# Patient Record
Sex: Female | Born: 1970 | Hispanic: No | Smoking: Never smoker
Health system: Southern US, Community
[De-identification: ages and names within clinical notes are randomized; demographics above are authoritative.]

## PROBLEM LIST (undated history)

## (undated) DIAGNOSIS — I10 Essential (primary) hypertension: Secondary | ICD-10-CM

## (undated) HISTORY — PX: ABDOMINAL HYSTERECTOMY: SHX81

---

## 2006-03-19 ENCOUNTER — Other Ambulatory Visit: Admission: RE | Admit: 2006-03-19 | Discharge: 2006-03-19 | Payer: Self-pay | Admitting: Family Medicine

## 2007-03-25 ENCOUNTER — Other Ambulatory Visit: Admission: RE | Admit: 2007-03-25 | Discharge: 2007-03-25 | Payer: Self-pay | Admitting: Family Medicine

## 2010-05-25 ENCOUNTER — Other Ambulatory Visit: Admission: RE | Admit: 2010-05-25 | Discharge: 2010-05-25 | Payer: Self-pay | Admitting: Family Medicine

## 2011-08-19 DIAGNOSIS — E559 Vitamin D deficiency, unspecified: Secondary | ICD-10-CM | POA: Insufficient documentation

## 2011-08-19 DIAGNOSIS — L709 Acne, unspecified: Secondary | ICD-10-CM | POA: Insufficient documentation

## 2011-08-19 HISTORY — DX: Acne, unspecified: L70.9

## 2011-10-31 DIAGNOSIS — D259 Leiomyoma of uterus, unspecified: Secondary | ICD-10-CM | POA: Insufficient documentation

## 2013-06-25 ENCOUNTER — Other Ambulatory Visit: Payer: Self-pay

## 2013-06-25 DIAGNOSIS — Z1231 Encounter for screening mammogram for malignant neoplasm of breast: Secondary | ICD-10-CM

## 2013-07-26 ENCOUNTER — Ambulatory Visit
Admission: RE | Admit: 2013-07-26 | Discharge: 2013-07-26 | Disposition: A | Discharge status: Home / Self Care | Payer: 59 | Source: Ambulatory Visit

## 2013-07-26 DIAGNOSIS — Z1231 Encounter for screening mammogram for malignant neoplasm of breast: Secondary | ICD-10-CM

## 2015-07-12 DIAGNOSIS — D251 Intramural leiomyoma of uterus: Secondary | ICD-10-CM | POA: Insufficient documentation

## 2016-03-06 DIAGNOSIS — R7309 Other abnormal glucose: Secondary | ICD-10-CM | POA: Diagnosis not present

## 2016-03-06 DIAGNOSIS — I10 Essential (primary) hypertension: Secondary | ICD-10-CM | POA: Diagnosis not present

## 2016-06-20 DIAGNOSIS — Z6836 Body mass index (BMI) 36.0-36.9, adult: Secondary | ICD-10-CM | POA: Diagnosis not present

## 2016-06-20 DIAGNOSIS — I10 Essential (primary) hypertension: Secondary | ICD-10-CM | POA: Diagnosis not present

## 2016-06-20 DIAGNOSIS — Z90710 Acquired absence of both cervix and uterus: Secondary | ICD-10-CM | POA: Diagnosis not present

## 2016-06-20 DIAGNOSIS — Z01419 Encounter for gynecological examination (general) (routine) without abnormal findings: Secondary | ICD-10-CM | POA: Diagnosis not present

## 2016-11-15 DIAGNOSIS — R7303 Prediabetes: Secondary | ICD-10-CM | POA: Diagnosis not present

## 2016-11-15 DIAGNOSIS — R7309 Other abnormal glucose: Secondary | ICD-10-CM | POA: Diagnosis not present

## 2016-11-15 DIAGNOSIS — Z0000 Encounter for general adult medical examination without abnormal findings: Secondary | ICD-10-CM | POA: Diagnosis not present

## 2016-11-15 DIAGNOSIS — E669 Obesity, unspecified: Secondary | ICD-10-CM | POA: Diagnosis not present

## 2016-11-15 DIAGNOSIS — I10 Essential (primary) hypertension: Secondary | ICD-10-CM | POA: Diagnosis not present

## 2016-11-15 DIAGNOSIS — Z6837 Body mass index (BMI) 37.0-37.9, adult: Secondary | ICD-10-CM | POA: Diagnosis not present

## 2017-10-06 DIAGNOSIS — I10 Essential (primary) hypertension: Secondary | ICD-10-CM | POA: Diagnosis not present

## 2017-10-06 DIAGNOSIS — E559 Vitamin D deficiency, unspecified: Secondary | ICD-10-CM | POA: Diagnosis not present

## 2017-10-06 DIAGNOSIS — E669 Obesity, unspecified: Secondary | ICD-10-CM | POA: Diagnosis not present

## 2017-10-06 DIAGNOSIS — R7303 Prediabetes: Secondary | ICD-10-CM | POA: Diagnosis not present

## 2017-10-06 DIAGNOSIS — Z6834 Body mass index (BMI) 34.0-34.9, adult: Secondary | ICD-10-CM | POA: Diagnosis not present

## 2018-02-04 DIAGNOSIS — E669 Obesity, unspecified: Secondary | ICD-10-CM | POA: Diagnosis not present

## 2018-02-04 DIAGNOSIS — Z0000 Encounter for general adult medical examination without abnormal findings: Secondary | ICD-10-CM | POA: Diagnosis not present

## 2018-02-04 DIAGNOSIS — Z1231 Encounter for screening mammogram for malignant neoplasm of breast: Secondary | ICD-10-CM | POA: Diagnosis not present

## 2018-02-04 DIAGNOSIS — E559 Vitamin D deficiency, unspecified: Secondary | ICD-10-CM | POA: Diagnosis not present

## 2018-02-04 DIAGNOSIS — I10 Essential (primary) hypertension: Secondary | ICD-10-CM | POA: Diagnosis not present

## 2018-02-04 DIAGNOSIS — R7303 Prediabetes: Secondary | ICD-10-CM | POA: Diagnosis not present

## 2018-02-26 ENCOUNTER — Emergency Department (HOSPITAL_COMMUNITY)
Admission: EM | Admit: 2018-02-26 | Discharge: 2018-02-26 | Disposition: A | Discharge status: Home / Self Care | Payer: No Typology Code available for payment source | Attending: Emergency Medicine | Admitting: Emergency Medicine

## 2018-02-26 ENCOUNTER — Encounter (HOSPITAL_COMMUNITY): Payer: Self-pay | Admitting: *Deleted

## 2018-02-26 ENCOUNTER — Emergency Department (HOSPITAL_COMMUNITY): Payer: No Typology Code available for payment source

## 2018-02-26 ENCOUNTER — Other Ambulatory Visit: Payer: Self-pay

## 2018-02-26 DIAGNOSIS — S0181XA Laceration without foreign body of other part of head, initial encounter: Secondary | ICD-10-CM | POA: Insufficient documentation

## 2018-02-26 DIAGNOSIS — S0285XA Fracture of orbit, unspecified, initial encounter for closed fracture: Secondary | ICD-10-CM

## 2018-02-26 DIAGNOSIS — S0282XA Fracture of other specified skull and facial bones, left side, initial encounter for closed fracture: Secondary | ICD-10-CM | POA: Insufficient documentation

## 2018-02-26 DIAGNOSIS — Z79899 Other long term (current) drug therapy: Secondary | ICD-10-CM | POA: Diagnosis not present

## 2018-02-26 DIAGNOSIS — Y9389 Activity, other specified: Secondary | ICD-10-CM | POA: Insufficient documentation

## 2018-02-26 DIAGNOSIS — Y92410 Unspecified street and highway as the place of occurrence of the external cause: Secondary | ICD-10-CM | POA: Insufficient documentation

## 2018-02-26 DIAGNOSIS — Y999 Unspecified external cause status: Secondary | ICD-10-CM | POA: Diagnosis not present

## 2018-02-26 DIAGNOSIS — I10 Essential (primary) hypertension: Secondary | ICD-10-CM | POA: Insufficient documentation

## 2018-02-26 DIAGNOSIS — R51 Headache: Secondary | ICD-10-CM | POA: Insufficient documentation

## 2018-02-26 DIAGNOSIS — M542 Cervicalgia: Secondary | ICD-10-CM | POA: Diagnosis not present

## 2018-02-26 DIAGNOSIS — S0993XA Unspecified injury of face, initial encounter: Secondary | ICD-10-CM | POA: Diagnosis present

## 2018-02-26 DIAGNOSIS — M545 Low back pain: Secondary | ICD-10-CM | POA: Diagnosis not present

## 2018-02-26 HISTORY — DX: Essential (primary) hypertension: I10

## 2018-02-26 MED ORDER — TETRACAINE HCL 0.5 % OP SOLN
2.0000 [drp] | Freq: Once | OPHTHALMIC | Status: AC
  Administered 2018-02-26: 2 [drp] via OPHTHALMIC
  Filled 2018-02-26: qty 4

## 2018-02-26 MED ORDER — LIDOCAINE-EPINEPHRINE-TETRACAINE (LET) SOLUTION
3.0000 mL | Freq: Once | NASAL | Status: AC
  Administered 2018-02-26: 3 mL via TOPICAL
  Filled 2018-02-26: qty 3

## 2018-02-26 MED ORDER — OXYCODONE-ACETAMINOPHEN 5-325 MG PO TABS: 2.0000 | ORAL_TABLET | Freq: Three times a day (TID) | ORAL | 0 refills | Status: DC | PRN

## 2018-02-26 MED ORDER — CEPHALEXIN 500 MG PO CAPS: 500.0000 mg | ORAL_CAPSULE | Freq: Two times a day (BID) | ORAL | 0 refills | Status: AC

## 2018-02-26 MED ORDER — LIDOCAINE HCL 2 % IJ SOLN
10.0000 mL | Freq: Once | INTRAMUSCULAR | Status: AC
  Administered 2018-02-26: 200 mg via INTRADERMAL
  Filled 2018-02-26: qty 20

## 2018-02-26 MED ORDER — MORPHINE SULFATE (PF) 4 MG/ML IV SOLN: 4.0000 mg | Freq: Once | INTRAVENOUS | Status: DC

## 2018-02-26 MED ORDER — FLUORESCEIN SODIUM 1 MG OP STRP
1.0000 | ORAL_STRIP | Freq: Once | OPHTHALMIC | Status: AC
  Administered 2018-02-26: 1 via OPHTHALMIC
  Filled 2018-02-26: qty 1

## 2018-02-26 NOTE — ED Notes (Addendum)
Pt Left eye is 20/50 , Pt right eye is 20/30 ... OU 20/50
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
## 2018-02-26 NOTE — Discharge Instructions (Addendum)
Please take the antibiotic as directed to help prevent infection in your wounds on your eye.    Prescription given for Percocet. Take medication as directed and do not operate machinery, drive a car, or work while taking this medication as it can make you drowsy.    Please follow the instructions for wound care that are attached your discharge paperwork.  Please follow-up with your nose and throat doctor in 4 to 5 days as directed.  Do not blow your nose, do not see the mouth open, and do not drink out of straws.  Please return to the emergency department if you have any severe headaches, vomiting, seizures, inability to move your eye in all directions, worsening vision changes, or any new or worsening symptoms.
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
## 2018-03-09 DIAGNOSIS — H31092 Other chorioretinal scars, left eye: Secondary | ICD-10-CM | POA: Diagnosis not present

## 2018-03-09 DIAGNOSIS — S0012XA Contusion of left eyelid and periocular area, initial encounter: Secondary | ICD-10-CM | POA: Diagnosis not present

## 2018-03-10 DIAGNOSIS — S01112D Laceration without foreign body of left eyelid and periocular area, subsequent encounter: Secondary | ICD-10-CM | POA: Diagnosis not present

## 2018-03-10 DIAGNOSIS — S0232XD Fracture of orbital floor, left side, subsequent encounter for fracture with routine healing: Secondary | ICD-10-CM | POA: Diagnosis not present

## 2018-03-11 DIAGNOSIS — R51 Headache: Secondary | ICD-10-CM | POA: Diagnosis not present

## 2018-03-11 DIAGNOSIS — G4700 Insomnia, unspecified: Secondary | ICD-10-CM | POA: Diagnosis not present

## 2018-03-11 DIAGNOSIS — M25561 Pain in right knee: Secondary | ICD-10-CM | POA: Diagnosis not present

## 2018-03-25 DIAGNOSIS — F419 Anxiety disorder, unspecified: Secondary | ICD-10-CM | POA: Diagnosis not present

## 2018-03-25 DIAGNOSIS — S0232XS Fracture of orbital floor, left side, sequela: Secondary | ICD-10-CM | POA: Diagnosis not present

## 2018-03-25 DIAGNOSIS — R51 Headache: Secondary | ICD-10-CM | POA: Diagnosis not present

## 2018-05-11 ENCOUNTER — Other Ambulatory Visit: Payer: Self-pay

## 2018-05-11 ENCOUNTER — Telehealth: Payer: Self-pay

## 2018-05-11 MED ORDER — BLOOD GLUCOSE MONITOR KIT: PACK | 0 refills | Status: DC

## 2018-05-11 NOTE — Telephone Encounter (Signed)
I don't know that this is causing you to have nausea.  We will send her a Rx for a glucometer with strips.

## 2018-05-11 NOTE — Telephone Encounter (Signed)
hi, I sometimes wake up nauseated in the mornings and wonder if during the night my glucose levels drop down low. Do you think I should start checking my glucose levels when I wake up feeling sick? if yes, could you send a script to [facility] pharmacy for a glucometer and  some test strips?

## 2018-05-11 NOTE — Telephone Encounter (Signed)
Patient notified YRL,RMA 

## 2018-06-08 ENCOUNTER — Ambulatory Visit: Payer: Self-pay | Admitting: Nurse Practitioner

## 2018-09-02 ENCOUNTER — Other Ambulatory Visit: Payer: Self-pay | Admitting: Nurse Practitioner

## 2018-09-04 ENCOUNTER — Other Ambulatory Visit: Payer: Self-pay | Admitting: Nurse Practitioner

## 2018-09-04 DIAGNOSIS — I10 Essential (primary) hypertension: Secondary | ICD-10-CM

## 2018-09-04 MED ORDER — DILTIAZEM HCL 120 MG PO TABS: 120.0000 mg | ORAL_TABLET | Freq: Every day | ORAL | 0 refills | Status: DC

## 2018-09-07 ENCOUNTER — Ambulatory Visit (INDEPENDENT_AMBULATORY_CARE_PROVIDER_SITE_OTHER): Payer: BLUE CROSS/BLUE SHIELD | Admitting: Nurse Practitioner

## 2018-09-07 ENCOUNTER — Encounter: Payer: Self-pay | Admitting: Nurse Practitioner

## 2018-09-07 VITALS — BP 142/88 | HR 90 | Temp 98.4°F | Ht 66.6 in | Wt 225.4 lb

## 2018-09-07 DIAGNOSIS — R7303 Prediabetes: Secondary | ICD-10-CM | POA: Insufficient documentation

## 2018-09-07 DIAGNOSIS — Z9114 Patient's other noncompliance with medication regimen: Secondary | ICD-10-CM

## 2018-09-07 DIAGNOSIS — I10 Essential (primary) hypertension: Secondary | ICD-10-CM | POA: Insufficient documentation

## 2018-09-07 MED ORDER — LISINOPRIL-HYDROCHLOROTHIAZIDE 10-12.5 MG PO TABS: 1.0000 | ORAL_TABLET | Freq: Every day | ORAL | 1 refills | Status: DC

## 2018-09-07 NOTE — Progress Notes (Signed)
Subjective:     Patient ID: Frances Estrada , female    DOB: 1971/02/20 , 48 y.o.   MRN: [id number]   Chief Complaint  Patient presents with  . Hypertension    HPI  Hypertension  This is a chronic problem. The current episode started more than 1 year ago. The problem is unchanged. The problem is uncontrolled. Pertinent negatives include no anxiety, blurred vision or chest pain. Past treatments include diuretics. The current treatment provides no improvement. There are no compliance problems.  There is no history of angina. There is no history of chronic renal disease.  Diabetes  She presents for her follow-up diabetic visit. Diabetes type: prediabetes. Her disease course has been stable. Pertinent negatives for hypoglycemia include no confusion, dizziness or nervousness/anxiousness. Pertinent negatives for diabetes include no blurred vision, no chest pain, no fatigue, no polydipsia, no polyphagia and no polyuria. There are no hypoglycemic complications. Symptoms are stable. There are no diabetic complications. Risk factors for coronary artery disease include sedentary lifestyle and obesity. Diabetic current diet: low carb mostly. When asked about meal planning, she reported none. She has not had a previous visit with a dietitian. Exercise: exercises when walking up steps and physically active as much as possible. An ACE inhibitor/angiotensin II receptor blocker is being taken. She does not see a podiatrist.Eye exam is current.     Past Medical History:  Diagnosis Date  . Hypertension      No family history on file.   Current Outpatient Medications:  .  ALPRAZolam (XANAX) 0.25 MG tablet, Take 0.25 mg by mouth 2 (two) times daily as needed., Disp: , Rfl: 0 .  diltiazem (CARDIZEM) 120 MG tablet, Take 1 tablet (120 mg total) by mouth daily., Disp: 90 tablet, Rfl: 0 .  lisinopril-hydrochlorothiazide (PRINZIDE,ZESTORETIC) 10-12.5 MG tablet, Take 1 tablet by mouth daily., Disp: 90 tablet, Rfl:  1 .  metFORMIN (GLUCOPHAGE) 500 MG tablet, Take 500 mg by mouth daily with breakfast., Disp: , Rfl:  .  Vitamin D, Ergocalciferol, (DRISDOL) 50000 units CAPS capsule, Take 50,000 Units by mouth every 7 (seven) days., Disp: , Rfl:  .  blood glucose meter kit and supplies KIT, Dispense based on patient and insurance preference. Use once daily as directed. (FOR ICD-10 R73.09). (Patient not taking: Reported on 09/07/2018), Disp: 1 each, Rfl: 0   No Known Allergies   Review of Systems  Constitutional: Negative.  Negative for fatigue.  Eyes: Negative for blurred vision and visual disturbance.  Respiratory: Negative.   Cardiovascular: Negative.  Negative for chest pain.  Gastrointestinal: Negative.   Endocrine: Negative.  Negative for polydipsia, polyphagia and polyuria.  Skin: Negative.   Neurological: Negative.  Negative for dizziness.  Psychiatric/Behavioral: Negative for confusion. The patient is not nervous/anxious.      Today's Vitals   09/07/18 1217  BP: (!) 142/88  Pulse: 90  Temp: 98.4 F (36.9 C)  TempSrc: Oral  SpO2: 95%  Weight: 225 lb 6.4 oz (102.2 kg)  Height: 5' 6.6" (1.692 m)   Body mass index is 35.73 kg/m.   Objective:  Physical Exam Vitals signs reviewed.  Constitutional:      Appearance: She is well-developed.  HENT:     Head: Normocephalic and atraumatic.  Eyes:     Pupils: Pupils are equal, round, and reactive to light.  Neck:     Musculoskeletal: Normal range of motion and neck supple.  Cardiovascular:     Rate and Rhythm: Normal rate and regular rhythm.  Pulses: Normal pulses.     Heart sounds: Normal heart sounds. No murmur.  Pulmonary:     Effort: Pulmonary effort is normal.     Breath sounds: Normal breath sounds.  Chest:     Chest wall: No tenderness.  Musculoskeletal: Normal range of motion.  Skin:    General: Skin is warm and dry.     Capillary Refill: Capillary refill takes less than 2 seconds.  Neurological:     General: No focal  deficit present.     Mental Status: She is alert and oriented to person, place, and time.     Cranial Nerves: No cranial nerve deficit.  Psychiatric:        Mood and Affect: Mood normal.        Behavior: Behavior normal.        Thought Content: Thought content normal.        Judgment: Judgment normal.         Assessment And Plan:     1. Essential hypertension . B/P is elevated today, she has missed some doses of her blood pressure medications.    Marland Kitchen BMP ordered to check renal function.  . The importance of regular exercise and dietary modification was stressed to the patient.  . Stressed importance of losing ten percent of her body weight to help with B/P control.  . The weight loss would help with decreasing cardiac and cancer risk as well.  - Hemoglobin A1c - CMP14 + Anion Gap  2. Prediabetes  Chronic, controlled  Continue with current medications  Encouraged to limit intake of sugary foods and drinks  Encouraged to increase physical activity to 150 minutes per week - Hemoglobin A1c - CMP14 + Anion Gap   [name], FNP

## 2018-09-08 LAB — HEMOGLOBIN A1C
ESTIMATED AVERAGE GLUCOSE: 123 mg/dL
HEMOGLOBIN A1C: 5.9 % — AB (ref 4.8–5.6)

## 2018-09-08 LAB — CMP14 + ANION GAP
A/G RATIO: 1.3 (ref 1.2–2.2)
ALK PHOS: 75 IU/L (ref 39–117)
ALT: 8 IU/L (ref 0–32)
AST: 16 IU/L (ref 0–40)
Albumin: 4.5 g/dL (ref 3.8–4.8)
Anion Gap: 18 mmol/L (ref 10.0–18.0)
BILIRUBIN TOTAL: 0.4 mg/dL (ref 0.0–1.2)
BUN/Creatinine Ratio: 13 (ref 9–23)
BUN: 9 mg/dL (ref 6–24)
CO2: 20 mmol/L (ref 20–29)
CREATININE: 0.67 mg/dL (ref 0.57–1.00)
Calcium: 9.7 mg/dL (ref 8.7–10.2)
Chloride: 100 mmol/L (ref 96–106)
GFR calc non Af Amer: 105 mL/min/{1.73_m2} (ref >59)
GFR, EST AFRICAN AMERICAN: 121 mL/min/{1.73_m2} (ref >59)
GLUCOSE: 92 mg/dL (ref 65–99)
Globulin, Total: 3.4 g/dL (ref 1.5–4.5)
Potassium: 4 mmol/L (ref 3.5–5.2)
Sodium: 138 mmol/L (ref 134–144)
TOTAL PROTEIN: 7.9 g/dL (ref 6.0–8.5)

## 2018-12-02 ENCOUNTER — Other Ambulatory Visit: Payer: Self-pay | Admitting: Nurse Practitioner

## 2018-12-02 DIAGNOSIS — I10 Essential (primary) hypertension: Secondary | ICD-10-CM

## 2019-03-07 ENCOUNTER — Other Ambulatory Visit: Payer: Self-pay | Admitting: Nurse Practitioner

## 2019-03-11 ENCOUNTER — Encounter: Payer: BLUE CROSS/BLUE SHIELD | Admitting: Nurse Practitioner

## 2019-12-23 ENCOUNTER — Other Ambulatory Visit: Payer: Self-pay

## 2019-12-23 ENCOUNTER — Ambulatory Visit (INDEPENDENT_AMBULATORY_CARE_PROVIDER_SITE_OTHER): Payer: BC Managed Care – PPO | Admitting: Nurse Practitioner

## 2019-12-23 ENCOUNTER — Encounter: Payer: Self-pay | Admitting: Nurse Practitioner

## 2019-12-23 VITALS — BP 118/80 | HR 98 | Temp 98.4°F | Ht 66.6 in | Wt 225.2 lb

## 2019-12-23 DIAGNOSIS — I10 Essential (primary) hypertension: Secondary | ICD-10-CM

## 2019-12-23 DIAGNOSIS — E559 Vitamin D deficiency, unspecified: Secondary | ICD-10-CM

## 2019-12-23 DIAGNOSIS — Z1159 Encounter for screening for other viral diseases: Secondary | ICD-10-CM

## 2019-12-23 DIAGNOSIS — Z6835 Body mass index (BMI) 35.0-35.9, adult: Secondary | ICD-10-CM

## 2019-12-23 DIAGNOSIS — R7303 Prediabetes: Secondary | ICD-10-CM | POA: Diagnosis not present

## 2019-12-23 DIAGNOSIS — E6609 Other obesity due to excess calories: Secondary | ICD-10-CM

## 2019-12-23 MED ORDER — DILTIAZEM HCL 120 MG PO TABS: 120.0000 mg | ORAL_TABLET | Freq: Every day | ORAL | 1 refills | Status: DC

## 2019-12-23 MED ORDER — METFORMIN HCL 500 MG PO TABS: 500.0000 mg | ORAL_TABLET | Freq: Every day | ORAL | 1 refills | Status: DC

## 2019-12-23 NOTE — Progress Notes (Signed)
This visit occurred during the SARS-CoV-2 public health emergency.  Safety protocols were in place, including screening questions prior to the visit, additional usage of staff PPE, and extensive cleaning of exam room while observing appropriate contact time as indicated for disinfecting solutions.  Subjective:     Patient ID: Frances Estrada , female    DOB: Apr 05, 1971 , 49 y.o.   MRN: [id number]   Chief Complaint  Patient presents with  . Hypertension    HPI  Wt Readings from Last 3 Encounters: 12/23/19 : 225 lb 3.2 oz (102.2 kg) 09/07/18 : 225 lb 6.4 oz (102.2 kg) 02/26/18 : 132 lb (59.9 kg)   Hypertension This is a chronic problem. The current episode started more than 1 year ago. The problem is unchanged. The problem is uncontrolled. Pertinent negatives include no anxiety, blurred vision, chest pain or palpitations. There are no associated agents to hypertension. Risk factors for coronary artery disease include obesity and sedentary lifestyle. Past treatments include diuretics. The current treatment provides no improvement. There are no compliance problems.  There is no history of angina. There is no history of chronic renal disease.  Diabetes She presents for her follow-up diabetic visit. Diabetes type: prediabetes. Her disease course has been stable. Pertinent negatives for hypoglycemia include no confusion, dizziness or nervousness/anxiousness. Pertinent negatives for diabetes include no blurred vision, no chest pain, no fatigue, no polydipsia, no polyphagia and no polyuria. There are no hypoglycemic complications. Symptoms are stable. There are no diabetic complications. Risk factors for coronary artery disease include sedentary lifestyle and obesity. She is compliant with treatment all of the time. Diabetic current diet: low carb mostly. When asked about meal planning, she reported none. She has not had a previous visit with a dietitian. She rarely participates in exercise. An ACE  inhibitor/angiotensin II receptor blocker is being taken. She does not see a podiatrist.Eye exam is current.     Past Medical History:  Diagnosis Date  . Hypertension      No family history on file.   Current Outpatient Medications:  .  ALPRAZolam (XANAX) 0.25 MG tablet, Take 0.25 mg by mouth 2 (two) times daily as needed., Disp: , Rfl: 0 .  diltiazem (CARDIZEM) 120 MG tablet, TAKE ONE TABLET BY MOUTH ONE TIME DAILY , Disp: 90 tablet, Rfl: 0 .  lisinopril-hydrochlorothiazide (ZESTORETIC) 10-12.5 MG tablet, TAKE ONE TABLET BY MOUTH ONE TIME DAILY , Disp: 90 tablet, Rfl: 0 .  Vitamin D, Ergocalciferol, (DRISDOL) 50000 units CAPS capsule, Take 50,000 Units by mouth every 7 (seven) days., Disp: , Rfl:  .  blood glucose meter kit and supplies KIT, Dispense based on patient and insurance preference. Use once daily as directed. (FOR ICD-10 R73.09). (Patient not taking: Reported on 09/07/2018), Disp: 1 each, Rfl: 0 .  metFORMIN (GLUCOPHAGE) 500 MG tablet, Take 500 mg by mouth daily with breakfast. (Patient not taking: Reported on 12/23/2019), Disp: , Rfl:    No Known Allergies   Review of Systems  Constitutional: Negative for fatigue.  Eyes: Negative for blurred vision.  Respiratory: Negative.   Cardiovascular: Negative for chest pain, palpitations and leg swelling.  Endocrine: Negative for polydipsia, polyphagia and polyuria.  Neurological: Negative for dizziness.  Psychiatric/Behavioral: Negative for confusion. The patient is not nervous/anxious.      Today's Vitals   12/23/19 1550  BP: 118/80  Pulse: 98  Temp: 98.4 F (36.9 C)  TempSrc: Oral  Weight: 225 lb 3.2 oz (102.2 kg)  Height: 5' 6.6" (1.692 m)  PainSc: 0-No pain   Body mass index is 35.7 kg/m.   Objective:  Physical Exam Vitals reviewed.  Constitutional:      General: She is not in acute distress.    Appearance: Normal appearance. She is well-developed. She is obese.  Cardiovascular:     Rate and Rhythm: Normal  rate and regular rhythm.     Pulses: Normal pulses.     Heart sounds: Normal heart sounds. No murmur heard.   Pulmonary:     Effort: Pulmonary effort is normal. No respiratory distress.     Breath sounds: Normal breath sounds.  Chest:     Chest wall: No tenderness.  Musculoskeletal:        General: Normal range of motion.  Skin:    General: Skin is warm and dry.     Capillary Refill: Capillary refill takes less than 2 seconds.  Neurological:     General: No focal deficit present.     Mental Status: She is alert and oriented to person, place, and time.     Cranial Nerves: No cranial nerve deficit.  Psychiatric:        Mood and Affect: Mood normal.        Behavior: Behavior normal.        Thought Content: Thought content normal.        Judgment: Judgment normal.         Assessment And Plan:     1. Essential hypertension  Chronic, good control  Continue with medications  2. Prediabetes  Chronic, stable  Will check HgbA1c   3. Class 2 obesity due to excess calories without serious comorbidity with body mass index (BMI) of 35.0 to 35.9 in adult  Encouraged to increase physical activity and eat healthy  Continue with current medications   [name], FNP    THE PATIENT IS ENCOURAGED TO PRACTICE SOCIAL DISTANCING DUE TO THE COVID-19 PANDEMIC.

## 2019-12-24 LAB — HEPATITIS C ANTIBODY: Hep C Virus Ab: <0.1 s/co ratio (ref 0.0–0.9)

## 2019-12-24 LAB — CMP14+EGFR
ALT: 8 IU/L (ref 0–32)
AST: 14 IU/L (ref 0–40)
Albumin/Globulin Ratio: 1.5 (ref 1.2–2.2)
Albumin: 4.6 g/dL (ref 3.8–4.8)
Alkaline Phosphatase: 71 IU/L (ref 48–121)
BUN/Creatinine Ratio: 13 (ref 9–23)
BUN: 10 mg/dL (ref 6–24)
Bilirubin Total: 0.4 mg/dL (ref 0.0–1.2)
CO2: 25 mmol/L (ref 20–29)
Calcium: 9.6 mg/dL (ref 8.7–10.2)
Chloride: 100 mmol/L (ref 96–106)
Creatinine, Ser: 0.77 mg/dL (ref 0.57–1.00)
GFR calc Af Amer: 106 mL/min/{1.73_m2} (ref >59)
GFR calc non Af Amer: 92 mL/min/{1.73_m2} (ref >59)
Globulin, Total: 3 g/dL (ref 1.5–4.5)
Glucose: 159 mg/dL — ABNORMAL HIGH (ref 65–99)
Potassium: 3.5 mmol/L (ref 3.5–5.2)
Sodium: 139 mmol/L (ref 134–144)
Total Protein: 7.6 g/dL (ref 6.0–8.5)

## 2019-12-24 LAB — HEMOGLOBIN A1C
Est. average glucose Bld gHb Est-mCnc: 120 mg/dL
Hgb A1c MFr Bld: 5.8 % — ABNORMAL HIGH (ref 4.8–5.6)

## 2019-12-24 LAB — VITAMIN D 25 HYDROXY (VIT D DEFICIENCY, FRACTURES): Vit D, 25-Hydroxy: 11.9 ng/mL — ABNORMAL LOW (ref 30.0–100.0)

## 2019-12-24 MED ORDER — VITAMIN D (ERGOCALCIFEROL) 1.25 MG (50000 UNIT) PO CAPS: 50000.0000 [IU] | ORAL_CAPSULE | ORAL | 1 refills | Status: DC

## 2020-06-28 ENCOUNTER — Other Ambulatory Visit: Payer: Self-pay | Admitting: Nurse Practitioner

## 2020-06-28 ENCOUNTER — Encounter: Payer: BC Managed Care – PPO | Admitting: Nurse Practitioner

## 2020-08-16 ENCOUNTER — Telehealth: Payer: Self-pay

## 2020-08-16 NOTE — Telephone Encounter (Signed)
Patient called stating she has a headache,fever,chills and a cough she stated at first she thought it was her sinus but now she thinks it might be covid.   I returned pt call and left her a vm to call the office so we can schedule her a lab visit so she can get tested. YL,RMA

## 2020-08-17 DIAGNOSIS — Z20822 Contact with and (suspected) exposure to covid-19: Secondary | ICD-10-CM | POA: Diagnosis not present

## 2020-09-14 ENCOUNTER — Other Ambulatory Visit: Payer: Self-pay

## 2020-09-14 ENCOUNTER — Ambulatory Visit (INDEPENDENT_AMBULATORY_CARE_PROVIDER_SITE_OTHER): Payer: BC Managed Care – PPO | Admitting: Nurse Practitioner

## 2020-09-14 ENCOUNTER — Encounter: Payer: Self-pay | Admitting: Nurse Practitioner

## 2020-09-14 VITALS — BP 142/80 | HR 102 | Temp 98.8°F | Ht 66.6 in | Wt 225.0 lb

## 2020-09-14 DIAGNOSIS — F419 Anxiety disorder, unspecified: Secondary | ICD-10-CM | POA: Diagnosis not present

## 2020-09-14 DIAGNOSIS — F3289 Other specified depressive episodes: Secondary | ICD-10-CM

## 2020-09-14 DIAGNOSIS — E559 Vitamin D deficiency, unspecified: Secondary | ICD-10-CM

## 2020-09-14 DIAGNOSIS — I10 Essential (primary) hypertension: Secondary | ICD-10-CM

## 2020-09-14 DIAGNOSIS — R7303 Prediabetes: Secondary | ICD-10-CM | POA: Diagnosis not present

## 2020-09-14 DIAGNOSIS — Z1211 Encounter for screening for malignant neoplasm of colon: Secondary | ICD-10-CM

## 2020-09-14 DIAGNOSIS — Z6835 Body mass index (BMI) 35.0-35.9, adult: Secondary | ICD-10-CM

## 2020-09-14 MED ORDER — BUPROPION HCL ER (XL) 150 MG PO TB24: 150.0000 mg | ORAL_TABLET | ORAL | 2 refills | Status: DC

## 2020-09-14 NOTE — Progress Notes (Deleted)
Rutherford Nail as a scribe for [name], FNP.,have documented all relevant documentation on the behalf of [name], FNP,as directed by  [name], FNP while in the presence of [name], [place]. This visit occurred during the SARS-CoV-2 public health emergency.  Safety protocols were in place, including screening questions prior to the visit, additional usage of staff PPE, and extensive cleaning of exam room while observing appropriate contact time as indicated for disinfecting solutions.  Subjective:     Patient ID: Frances Estrada , female    DOB: 27-Dec-1970 , 50 y.o.   MRN: [id number]   Chief Complaint  Patient presents with  . Hypertension  . Anxiety    HPI  Pt here today for a bp follow up   Hypertension This is a chronic problem. The current episode started more than 1 year ago. The problem is unchanged. The problem is uncontrolled. Pertinent negatives include no anxiety, blurred vision, chest pain, headaches or palpitations. There are no associated agents to hypertension. Risk factors for coronary artery disease include obesity and sedentary lifestyle. Past treatments include diuretics. The current treatment provides no improvement. There are no compliance problems.  There is no history of angina. There is no history of chronic renal disease.  Diabetes She presents for her follow-up diabetic visit. Diabetes type: prediabetes. Her disease course has been stable. Pertinent negatives for hypoglycemia include no confusion, dizziness, headaches or nervousness/anxiousness. Pertinent negatives for diabetes include no blurred vision, no chest pain, no fatigue, no polydipsia, no polyphagia and no polyuria. There are no hypoglycemic complications. Symptoms are stable. There are no diabetic complications. Risk factors for coronary artery disease include sedentary lifestyle and obesity. She is compliant with treatment all of the time. Diabetic current diet: low carb mostly. When  asked about meal planning, she reported none. She has not had a previous visit with a dietitian. She rarely participates in exercise. An ACE inhibitor/angiotensin II receptor blocker is being taken. She does not see a podiatrist.Eye exam is current.     Past Medical History:  Diagnosis Date  . Hypertension      No family history on file.   Current Outpatient Medications:  .  ALPRAZolam (XANAX) 0.25 MG tablet, Take 0.25 mg by mouth 2 (two) times daily as needed., Disp: , Rfl: 0 .  diltiazem (CARDIZEM) 120 MG tablet, Take 1 tablet (120 mg total) by mouth daily., Disp: 90 tablet, Rfl: 1 .  lisinopril-hydrochlorothiazide (ZESTORETIC) 10-12.5 MG tablet, TAKE ONE TABLET BY MOUTH ONE TIME DAILY , Disp: 90 tablet, Rfl: 0 .  metFORMIN (GLUCOPHAGE) 500 MG tablet, Take 1 tablet (500 mg total) by mouth daily with breakfast., Disp: 90 tablet, Rfl: 1 .  Vitamin D, Ergocalciferol, (DRISDOL) 1.25 MG (50000 UNIT) CAPS capsule, Take 1 capsule (50,000 Units total) by mouth 2 (two) times a week., Disp: 24 capsule, Rfl: 1 .  blood glucose meter kit and supplies KIT, Dispense based on patient and insurance preference. Use once daily as directed. (FOR ICD-10 R73.09). (Patient not taking: No sig reported), Disp: 1 each, Rfl: 0   No Known Allergies   Review of Systems  Constitutional: Negative.  Negative for fatigue.  HENT: Negative.   Eyes: Negative for blurred vision.  Cardiovascular: Negative for chest pain and palpitations.  Endocrine: Negative for polydipsia, polyphagia and polyuria.  Musculoskeletal: Negative.   Skin: Negative.   Neurological: Negative for dizziness and headaches.  Psychiatric/Behavioral: Negative.  Negative for confusion. The patient is not nervous/anxious.  Today's Vitals   09/14/20 1130  BP: (!) 142/80  Pulse: (!) 102  Temp: 98.8 F (37.1 C)  TempSrc: Oral  Weight: 225 lb (102.1 kg)  Height: 5' 6.6" (1.692 m)   Body mass index is 35.66 kg/m.  Wt Readings from Last 3  Encounters:  09/14/20 225 lb (102.1 kg)  12/23/19 225 lb 3.2 oz (102.2 kg)  09/07/18 225 lb 6.4 oz (102.2 kg)   Objective:  Physical Exam Vitals reviewed.  Constitutional:      General: She is not in acute distress.    Appearance: Normal appearance. She is obese.  HENT:     Head: Normocephalic.     Right Ear: Tympanic membrane, ear canal and external ear normal. There is no impacted cerumen.     Left Ear: Tympanic membrane, ear canal and external ear normal. There is no impacted cerumen.     Nose: Nose normal. No congestion.     Mouth/Throat:     Mouth: Mucous membranes are moist.  Eyes:     Extraocular Movements: Extraocular movements intact.     Conjunctiva/sclera: Conjunctivae normal.     Pupils: Pupils are equal, round, and reactive to light.  Cardiovascular:     Rate and Rhythm: Normal rate and regular rhythm.     Pulses: Normal pulses.     Heart sounds: Normal heart sounds. No murmur heard.   Pulmonary:     Effort: Pulmonary effort is normal. No respiratory distress.     Breath sounds: Normal breath sounds. No wheezing.  Skin:    General: Skin is warm and dry.     Capillary Refill: Capillary refill takes less than 2 seconds.  Neurological:     General: No focal deficit present.     Mental Status: She is alert and oriented to person, place, and time.     Cranial Nerves: No cranial nerve deficit.  Psychiatric:        Mood and Affect: Mood normal.        Behavior: Behavior normal.        Thought Content: Thought content normal.        Judgment: Judgment normal.         Assessment And Plan:     1. Essential hypertension  2. Prediabetes  She is encouraged to strive for BMI less than 30 to decrease cardiac risk. Advised to aim for at least 150 minutes of exercise per week.   Patient was given opportunity to ask questions. Patient verbalized understanding of the plan and was able to repeat key elements of the plan. All questions were answered to their  satisfaction.  Hassell Done, CMA   I, Hassell Done, CMA, have reviewed all documentation for this visit. The documentation on 09/14/20 for the exam, diagnosis, procedures, and orders are all accurate and complete.  THE PATIENT IS ENCOURAGED TO PRACTICE SOCIAL DISTANCING DUE TO THE COVID-19 PANDEMIC.

## 2020-09-14 NOTE — Patient Instructions (Addendum)

## 2020-09-14 NOTE — Progress Notes (Signed)
This visit occurred during the SARS-CoV-2 public health emergency.  Safety protocols were in place, including screening questions prior to the visit, additional usage of staff PPE, and extensive cleaning of exam room while observing appropriate contact time as indicated for disinfecting solutions.  Subjective:     Patient ID: Frances Estrada , female    DOB: Aug 25, 1970 , 50 y.o.   MRN: [id number]   Chief Complaint  Patient presents with  . Hypertension  . Anxiety    HPI  Wt Readings from Last 3 Encounters: 12/23/19 : 225 lb 3.2 oz (102.2 kg) 09/07/18 : 225 lb 6.4 oz (102.2 kg) 02/26/18 : 132 lb (59.9 kg)  She has cut back on fried and fatty foods. Noticed when she ate sweet foods her stomach was upset.  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 She has been biting her bottom lip.    Hypertension This is a chronic problem. The current episode started more than 1 year ago. The problem is unchanged. The problem is uncontrolled. Pertinent negatives include no blurred vision, chest pain or palpitations. There are no associated agents to hypertension. Risk factors for coronary artery disease include obesity and sedentary lifestyle. Past treatments include diuretics. The current treatment provides no improvement. There are no compliance problems.  There is no history of angina. There is no history of chronic renal disease.  Anxiety Presents for follow-up visit. Symptoms include irritability and nervous/anxious behavior. Patient reports no chest pain, depressed mood or palpitations.    Diabetes She presents for her follow-up diabetic visit. Diabetes type: prediabetes. Her disease course has been stable. Hypoglycemia symptoms include nervousness/anxiousness. Pertinent negatives for diabetes include no blurred vision, no chest pain, no fatigue, no polydipsia, no polyphagia and no polyuria. There are no hypoglycemic complications. Symptoms are stable. There are no  diabetic complications. Risk factors for coronary artery disease include sedentary lifestyle and obesity. She is compliant with treatment all of the time. Diabetic current diet: low carb mostly. When asked about meal planning, she reported none. She has not had a previous visit with a dietitian. She rarely participates in exercise. An ACE inhibitor/angiotensin II receptor blocker is being taken. She does not see a podiatrist.Eye exam is current.     Past Medical History:  Diagnosis Date  . Hypertension      History reviewed. No pertinent family history.   Current Outpatient Medications:  .  ALPRAZolam (XANAX) 0.25 MG tablet, Take 0.25 mg by mouth 2 (two) times daily as needed., Disp: , Rfl: 0 .  buPROPion (WELLBUTRIN XL) 150 MG 24 hr tablet, Take 1 tablet (150 mg total) by mouth every morning., Disp: 30 tablet, Rfl: 2 .  diltiazem (CARDIZEM) 120 MG tablet, Take 1 tablet (120 mg total) by mouth daily., Disp: 90 tablet, Rfl: 1 .  metFORMIN (GLUCOPHAGE) 500 MG tablet, Take 1 tablet (500 mg total) by mouth daily with breakfast., Disp: 90 tablet, Rfl: 1 .  Vitamin D, Ergocalciferol, (DRISDOL) 1.25 MG (50000 UNIT) CAPS capsule, Take 1 capsule (50,000 Units total) by mouth 2 (two) times a week., Disp: 24 capsule, Rfl: 1 .  blood glucose meter kit and supplies KIT, Dispense based on patient and insurance preference. Use once daily as directed. (FOR ICD-10 R73.09). (Patient not taking: No sig reported), Disp: 1 each, Rfl: 0 .  lisinopril-hydrochlorothiazide (ZESTORETIC) 10-12.5 MG tablet, TAKE ONE TABLET BY MOUTH ONE TIME DAILY, Disp: 90 tablet, Rfl: 0   No Known Allergies   Review of  Systems  Constitutional: Positive for irritability. Negative for fatigue.  Eyes: Negative for blurred vision.  Respiratory: Negative.   Cardiovascular: Negative for chest pain, palpitations and leg swelling.  Endocrine: Negative for polydipsia, polyphagia and polyuria.  Psychiatric/Behavioral: Negative for agitation.  The patient is nervous/anxious.      Today's Vitals   09/14/20 1130  BP: (!) 142/80  Pulse: (!) 102  Temp: 98.8 F (37.1 C)  TempSrc: Oral  Weight: 225 lb (102.1 kg)  Height: 5' 6.6" (1.692 m)   Body mass index is 35.66 kg/m.   Objective:  Physical Exam Vitals reviewed.  Constitutional:      General: She is not in acute distress.    Appearance: Normal appearance.  Cardiovascular:     Rate and Rhythm: Normal rate and regular rhythm.     Pulses: Normal pulses.     Heart sounds: Normal heart sounds. No murmur heard.   Pulmonary:     Effort: Pulmonary effort is normal. No respiratory distress.     Breath sounds: Normal breath sounds.  Skin:    Capillary Refill: Capillary refill takes less than 2 seconds.  Neurological:     General: No focal deficit present.     Mental Status: She is alert and oriented to person, place, and time.     Cranial Nerves: No cranial nerve deficit.  Psychiatric:        Attention and Perception: Attention and perception normal.        Mood and Affect: Mood is anxious (when talking about work slightly).        Behavior: Behavior normal.        Thought Content: Thought content normal.        Cognition and Memory: Cognition normal.        Judgment: Judgment normal.         Assessment And Plan:     1. Essential hypertension  Chronic, blood pressure is slightly elevated today  Encouraged to limit intake of high salt foods and to increase physical activity - BMP8+eGFR  2. Prediabetes  Chronic, had stopped taking metformin  Will check Hgba1c - Hemoglobin A1c - BMP8+eGFR  3. Anxiety  Chronic, symptoms are worsening which is affecting her at work  Will refer for counseling - Ambulatory referral to Psychology - buPROPion (WELLBUTRIN XL) 150 MG 24 hr tablet; Take 1 tablet (150 mg total) by mouth every morning.  Dispense: 30 tablet; Refill: 2  4. Other depression  Depression screen score is 12  Will start her on wellbutrin  This  may also help her with any cravings she may have - buPROPion (WELLBUTRIN XL) 150 MG 24 hr tablet; Take 1 tablet (150 mg total) by mouth every morning.  Dispense: 30 tablet; Refill: 2  5. Encounter for screening colonoscopy  According to USPTF Colorectal cancer Screening guidelines. Colonoscopy is recommended every 10 years, starting at age 54 years.  Will refer to GI for colon cancer screening. - Ambulatory referral to Gastroenterology  6. Vitamin D deficiency  Will check vitamin D level and supplement as needed.     Also encouraged to spend 15 minutes in the sun daily.  - VITAMIN D 25 Hydroxy (Vit-D Deficiency, Fractures)  7. BMI 35.0-35.9,adult She is encouraged to strive for BMI less than 30 to decrease cardiac risk. Advised to aim for at least 150 minutes of exercise per week.      Patient was given opportunity to ask questions. Patient verbalized understanding of the plan and was able  to repeat key elements of the plan. All questions were answered to their satisfaction.   [name], FNP    I, [name], FNP, have reviewed all documentation for this visit. The documentation on 09/14/20 for the exam, diagnosis, procedures, and orders are all accurate and complete.   THE PATIENT IS ENCOURAGED TO PRACTICE SOCIAL DISTANCING DUE TO THE COVID-19 PANDEMIC.

## 2020-09-14 NOTE — Progress Notes (Incomplete)
Rutherford Nail as a scribe for [name], FNP.,have documented all relevant documentation on the behalf of [name], FNP,as directed by  [name], FNP while in the presence of [name], [place]. This visit occurred during the SARS-CoV-2 public health emergency.  Safety protocols were in place, including screening questions prior to the visit, additional usage of staff PPE, and extensive cleaning of exam room while observing appropriate contact time as indicated for disinfecting solutions.  Subjective:     Patient ID: Frances Estrada , female    DOB: [birth year] , 50 y.o.   MRN: [id number]   Chief Complaint  Patient presents with  . Hypertension  . Anxiety    HPI  Pt here today for a bp follow up   Wt Readings from Last 3 Encounters: 09/14/20 : 225 lb (102.1 kg) 12/23/19 : 225 lb 3.2 oz (102.2 kg) 09/07/18 : 225 lb 6.4 oz (102.2 kg)  She has cut back on fried and fatty foods. Noticed when she ate sweet foods her stomach was upset.   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    Hypertension This is a chronic problem. The current episode started more than 1 year ago. The problem is unchanged. The problem is uncontrolled. Pertinent negatives include no anxiety, blurred vision, chest pain, headaches or palpitations. There are no associated agents to hypertension. Risk factors for coronary artery disease include obesity and sedentary lifestyle. Past treatments include diuretics. The current treatment provides no improvement. There are no compliance problems.  There is no history of angina. There is no history of chronic renal disease.  Diabetes She presents for her follow-up diabetic visit. Diabetes type: prediabetes. Her disease course has been stable. Pertinent negatives for hypoglycemia include no confusion, dizziness, headaches or nervousness/anxiousness. Pertinent negatives for diabetes include no blurred vision, no chest pain, no fatigue,  no polydipsia, no polyphagia and no polyuria. There are no hypoglycemic complications. Symptoms are stable. There are no diabetic complications. Risk factors for coronary artery disease include sedentary lifestyle and obesity. She is compliant with treatment all of the time. Diabetic current diet: low carb mostly. When asked about meal planning, she reported none. She has not had a previous visit with a dietitian. She rarely participates in exercise. An ACE inhibitor/angiotensin II receptor blocker is being taken. She does not see a podiatrist.Eye exam is current.  Anxiety Presents for follow-up visit. Symptoms include irritability. Patient reports no chest pain, confusion, dizziness, nervous/anxious behavior or palpitations. Symptoms occur constantly. The severity of symptoms is moderate. Nighttime awakenings: none.       Past Medical History:  Diagnosis Date  . Hypertension      No family history on file.   Current Outpatient Medications:  .  ALPRAZolam (XANAX) 0.25 MG tablet, Take 0.25 mg by mouth 2 (two) times daily as needed., Disp: , Rfl: 0 .  diltiazem (CARDIZEM) 120 MG tablet, Take 1 tablet (120 mg total) by mouth daily., Disp: 90 tablet, Rfl: 1 .  lisinopril-hydrochlorothiazide (ZESTORETIC) 10-12.5 MG tablet, TAKE ONE TABLET BY MOUTH ONE TIME DAILY , Disp: 90 tablet, Rfl: 0 .  metFORMIN (GLUCOPHAGE) 500 MG tablet, Take 1 tablet (500 mg total) by mouth daily with breakfast., Disp: 90 tablet, Rfl: 1 .  Vitamin D, Ergocalciferol, (DRISDOL) 1.25 MG (50000 UNIT) CAPS capsule, Take 1 capsule (50,000 Units total) by mouth 2 (two) times a week., Disp: 24 capsule, Rfl: 1 .  blood glucose meter kit and supplies KIT, Dispense  based on patient and insurance preference. Use once daily as directed. (FOR ICD-10 R73.09). (Patient not taking: No sig reported), Disp: 1 each, Rfl: 0   No Known Allergies   Review of Systems  Constitutional: Positive for irritability. Negative for fatigue.  HENT:  Negative.   Eyes: Negative for blurred vision.  Cardiovascular: Negative for chest pain, palpitations and leg swelling.  Endocrine: Negative for polydipsia, polyphagia and polyuria.  Musculoskeletal: Negative.   Skin: Negative.   Neurological: Negative for dizziness and headaches.  Psychiatric/Behavioral: Negative.  Negative for confusion. The patient is not nervous/anxious.      Today's Vitals   09/14/20 1130  BP: (!) 142/80  Pulse: (!) 102  Temp: 98.8 F (37.1 C)  TempSrc: Oral  Weight: 225 lb (102.1 kg)  Height: 5' 6.6" (1.692 m)   Body mass index is 35.66 kg/m.  Wt Readings from Last 3 Encounters:  09/14/20 225 lb (102.1 kg)  12/23/19 225 lb 3.2 oz (102.2 kg)  09/07/18 225 lb 6.4 oz (102.2 kg)   Objective:  Physical Exam Vitals reviewed.  Constitutional:      General: She is not in acute distress.    Appearance: Normal appearance. She is obese.  HENT:     Head: Normocephalic.     Right Ear: Tympanic membrane, ear canal and external ear normal. There is no impacted cerumen.     Left Ear: Tympanic membrane, ear canal and external ear normal. There is no impacted cerumen.     Nose: Nose normal. No congestion.     Mouth/Throat:     Mouth: Mucous membranes are moist.  Eyes:     Extraocular Movements: Extraocular movements intact.     Conjunctiva/sclera: Conjunctivae normal.     Pupils: Pupils are equal, round, and reactive to light.  Cardiovascular:     Rate and Rhythm: Normal rate and regular rhythm.     Pulses: Normal pulses.     Heart sounds: Normal heart sounds. No murmur heard.   Pulmonary:     Effort: Pulmonary effort is normal. No respiratory distress.     Breath sounds: Normal breath sounds. No wheezing.  Skin:    General: Skin is warm and dry.     Capillary Refill: Capillary refill takes less than 2 seconds.  Neurological:     General: No focal deficit present.     Mental Status: She is alert and oriented to person, place, and time.     Cranial  Nerves: No cranial nerve deficit.  Psychiatric:        Mood and Affect: Mood normal.        Behavior: Behavior normal.        Thought Content: Thought content normal.        Judgment: Judgment normal.         Assessment And Plan:     1. Essential hypertension  2. Prediabetes  She is encouraged to strive for BMI less than 30 to decrease cardiac risk. Advised to aim for at least 150 minutes of exercise per week.   Patient was given opportunity to ask questions. Patient verbalized understanding of the plan and was able to repeat key elements of the plan. All questions were answered to their satisfaction.  Arnette Felts, FNP   I, Arnette Felts, FNP, have reviewed all documentation for this visit. The documentation on 09/14/20 for the exam, diagnosis, procedures, and orders are all accurate and complete.  THE PATIENT IS ENCOURAGED TO PRACTICE SOCIAL DISTANCING DUE TO THE COVID-19  PANDEMIC.

## 2020-09-15 ENCOUNTER — Other Ambulatory Visit: Payer: Self-pay | Admitting: Nurse Practitioner

## 2020-09-15 LAB — BMP8+EGFR
BUN/Creatinine Ratio: 11 (ref 9–23)
BUN: 9 mg/dL (ref 6–24)
CO2: 19 mmol/L — ABNORMAL LOW (ref 20–29)
Calcium: 9.5 mg/dL (ref 8.7–10.2)
Chloride: 104 mmol/L (ref 96–106)
Creatinine, Ser: 0.82 mg/dL (ref 0.57–1.00)
Glucose: 108 mg/dL — ABNORMAL HIGH (ref 65–99)
Potassium: 3.9 mmol/L (ref 3.5–5.2)
Sodium: 141 mmol/L (ref 134–144)
eGFR: 88 mL/min/{1.73_m2} (ref >59)

## 2020-09-15 LAB — HEMOGLOBIN A1C
Est. average glucose Bld gHb Est-mCnc: 131 mg/dL
Hgb A1c MFr Bld: 6.2 % — ABNORMAL HIGH (ref 4.8–5.6)

## 2020-09-15 LAB — VITAMIN D 25 HYDROXY (VIT D DEFICIENCY, FRACTURES): Vit D, 25-Hydroxy: 16.6 ng/mL — ABNORMAL LOW (ref 30.0–100.0)

## 2020-10-09 ENCOUNTER — Other Ambulatory Visit: Payer: Self-pay

## 2020-10-09 DIAGNOSIS — F419 Anxiety disorder, unspecified: Secondary | ICD-10-CM

## 2020-10-09 DIAGNOSIS — I10 Essential (primary) hypertension: Secondary | ICD-10-CM

## 2020-10-09 DIAGNOSIS — F3289 Other specified depressive episodes: Secondary | ICD-10-CM

## 2020-10-09 MED ORDER — DILTIAZEM HCL 120 MG PO TABS: 120.0000 mg | ORAL_TABLET | Freq: Every day | ORAL | 1 refills | Status: DC

## 2020-10-09 MED ORDER — BUPROPION HCL ER (XL) 150 MG PO TB24: 150.0000 mg | ORAL_TABLET | ORAL | 2 refills | Status: DC

## 2020-10-09 MED ORDER — LISINOPRIL-HYDROCHLOROTHIAZIDE 10-12.5 MG PO TABS: 1.0000 | ORAL_TABLET | Freq: Every day | ORAL | 0 refills | Status: DC

## 2020-10-09 MED ORDER — VITAMIN D (ERGOCALCIFEROL) 1.25 MG (50000 UNIT) PO CAPS: 50000.0000 [IU] | ORAL_CAPSULE | ORAL | 1 refills | Status: DC

## 2020-10-12 ENCOUNTER — Ambulatory Visit (INDEPENDENT_AMBULATORY_CARE_PROVIDER_SITE_OTHER): Payer: BC Managed Care – PPO | Admitting: Nurse Practitioner

## 2020-10-12 ENCOUNTER — Other Ambulatory Visit: Payer: Self-pay

## 2020-10-12 ENCOUNTER — Encounter: Payer: Self-pay | Admitting: Nurse Practitioner

## 2020-10-12 VITALS — BP 122/86 | HR 81 | Temp 98.0°F | Ht 67.2 in | Wt 224.4 lb

## 2020-10-12 DIAGNOSIS — F5104 Psychophysiologic insomnia: Secondary | ICD-10-CM

## 2020-10-12 DIAGNOSIS — F419 Anxiety disorder, unspecified: Secondary | ICD-10-CM | POA: Diagnosis not present

## 2020-10-12 DIAGNOSIS — R7303 Prediabetes: Secondary | ICD-10-CM

## 2020-10-12 DIAGNOSIS — F3289 Other specified depressive episodes: Secondary | ICD-10-CM

## 2020-10-12 MED ORDER — MAGNESIUM 250 MG PO TABS: 1.0000 | ORAL_TABLET | Freq: Every day | ORAL | 1 refills | Status: AC

## 2020-10-12 MED ORDER — BUPROPION HCL ER (XL) 150 MG PO TB24: 150.0000 mg | ORAL_TABLET | ORAL | 1 refills | Status: DC

## 2020-10-12 MED ORDER — ALPRAZOLAM 0.25 MG PO TABS: 0.2500 mg | ORAL_TABLET | Freq: Two times a day (BID) | ORAL | 0 refills | Status: AC | PRN

## 2020-10-12 MED ORDER — METFORMIN HCL ER 750 MG PO TB24
750.0000 mg | ORAL_TABLET | Freq: Every day | ORAL | 1 refills | Status: DC
Start: 2020-10-12 — End: 2021-05-30

## 2020-10-12 NOTE — Progress Notes (Signed)
I,Yamilka Roman [place],acting as a scribe for Janece Moore, FNP.,have documented all relevant documentation on the behalf of Janece Moore, FNP,as directed by  Janece Moore, FNP while in the presence of Janece Moore, FNP. This visit occurred during the SARS-CoV-2 public health emergency.  Safety protocols were in place, including screening questions prior to the visit, additional usage of staff PPE, and extensive cleaning of exam room while observing appropriate contact time as indicated for disinfecting solutions.  Subjective:     Patient ID: Frances Estrada , female    DOB: 03/22/1971 , 49 y.o.   MRN: [id number]   Chief Complaint  Patient presents with  . Anxiety    HPI  Patient presents today for an anxiety f/u. She is tolerating Wellbutrin well. Feels like helping with some of her behaviors, notices she is doing certain things less. When she first started taking the medication she was more "snappy". She does continue to monitor her sugar intake.  She is not able to drink as many sodas makes her feel sick to her stomach.   She is more anxious during the day, at night she is thinking about a lot. She had a hysterectomy.  She tries to avoid eating sugars at night since having an episode of eating a bag of twix. She is working day shift. Sometimes she will take a nap during the day, then will have trouble falling asleep.     Insomnia Primary symptoms: no fragmented sleep, sleep disturbance.  The current episode started more than one year. The onset quality is gradual. The problem occurs intermittently. The problem has been waxing and waning since onset. The symptoms are aggravated by anxiety and SSRI use. How many beverages per day that contain caffeine: 2-3.  Types of beverages you drink: soda. Past treatments include alcohol. Typical bedtime:  10-11 P.M..  PMH includes: hypertension.     Past Medical History:  Diagnosis Date  . Hypertension      History reviewed. No pertinent family  history.   Current Outpatient Medications:  .  diltiazem (CARDIZEM) 120 MG tablet, Take 1 tablet (120 mg total) by mouth daily., Disp: 90 tablet, Rfl: 1 .  lisinopril-hydrochlorothiazide (ZESTORETIC) 10-12.5 MG tablet, Take 1 tablet by mouth daily., Disp: 90 tablet, Rfl: 0 .  Magnesium 250 MG TABS, Take 1 tablet (250 mg total) by mouth daily., Disp: 90 tablet, Rfl: 1 .  metFORMIN (GLUCOPHAGE XR) 750 MG 24 hr tablet, Take 1 tablet (750 mg total) by mouth daily with breakfast., Disp: 90 tablet, Rfl: 1 .  Vitamin D, Ergocalciferol, (DRISDOL) 1.25 MG (50000 UNIT) CAPS capsule, Take 1 capsule (50,000 Units total) by mouth 2 (two) times a week., Disp: 24 capsule, Rfl: 1 .  ALPRAZolam (XANAX) 0.25 MG tablet, Take 1 tablet (0.25 mg total) by mouth 2 (two) times daily as needed., Disp: 30 tablet, Rfl: 0 .  blood glucose meter kit and supplies KIT, Dispense based on patient and insurance preference. Use once daily as directed. (FOR ICD-10 R73.09). (Patient not taking: No sig reported), Disp: 1 each, Rfl: 0 .  buPROPion (WELLBUTRIN XL) 150 MG 24 hr tablet, Take 1 tablet (150 mg total) by mouth every morning., Disp: 90 tablet, Rfl: 1   No Known Allergies   Review of Systems  Constitutional: Negative.   Respiratory: Negative.   Cardiovascular: Negative.  Negative for chest pain, palpitations and leg swelling.  Neurological: Negative for headaches.  Psychiatric/Behavioral: Positive for sleep disturbance. The patient is nervous/anxious and has insomnia.        Today's Vitals   10/12/20 1553  BP: 122/86  Pulse: 81  Temp: 98 F (36.7 C)  TempSrc: Oral  Weight: 224 lb 6.4 oz (101.8 kg)  Height: 5' 7.2" (1.707 m)  PainSc: 0-No pain   Body mass index is 34.94 kg/m.   Objective:  Physical Exam Vitals reviewed.  Constitutional:      General: She is not in acute distress.    Appearance: Normal appearance. She is obese.  Cardiovascular:     Rate and Rhythm: Normal rate and regular rhythm.      Pulses: Normal pulses.     Heart sounds: Normal heart sounds. No murmur heard.   Pulmonary:     Effort: Pulmonary effort is normal. No respiratory distress.     Breath sounds: Normal breath sounds. No wheezing.  Neurological:     General: No focal deficit present.     Mental Status: She is alert and oriented to person, place, and time.     Cranial Nerves: No cranial nerve deficit.  Psychiatric:        Mood and Affect: Mood normal.        Behavior: Behavior normal.        Thought Content: Thought content normal.        Judgment: Judgment normal.         Assessment And Plan:     1. Anxiety  Chronic, doing better since starting Wellbutrin, continues to have some episodes of anxiety when trying to go to sleep. She has racing thoughts.  - Magnesium 250 MG TABS; Take 1 tablet (250 mg total) by mouth daily.  Dispense: 90 tablet; Refill: 1 - ALPRAZolam (XANAX) 0.25 MG tablet; Take 1 tablet (0.25 mg total) by mouth 2 (two) times daily as needed.  Dispense: 30 tablet; Refill: 0 - buPROPion (WELLBUTRIN XL) 150 MG 24 hr tablet; Take 1 tablet (150 mg total) by mouth every morning.  Dispense: 90 tablet; Refill: 1  2. Psychophysiological insomnia  Chronic, worsening over the last few months. Encouraged to take magnesium with evening meals   Will also provide her with limited supply of alprazolam as she prepares to go to a therapist.  - Magnesium 250 MG TABS; Take 1 tablet (250 mg total) by mouth daily.  Dispense: 90 tablet; Refill: 1 - ALPRAZolam (XANAX) 0.25 MG tablet; Take 1 tablet (0.25 mg total) by mouth 2 (two) times daily as needed.  Dispense: 30 tablet; Refill: 0  3. Prediabetes  Chronic, I will change her to metformin XR to help with GI upset - metFORMIN (GLUCOPHAGE XR) 750 MG 24 hr tablet; Take 1 tablet (750 mg total) by mouth daily with breakfast.  Dispense: 90 tablet; Refill: 1  4. Other depression  Chronic, doing better with wellbutrin - buPROPion (WELLBUTRIN XL) 150 MG 24 hr  tablet; Take 1 tablet (150 mg total) by mouth every morning.  Dispense: 90 tablet; Refill: 1     Patient was given opportunity to ask questions. Patient verbalized understanding of the plan and was able to repeat key elements of the plan. All questions were answered to their satisfaction.  [name], FNP   I, [name], FNP, have reviewed all documentation for this visit. The documentation on 10/12/20 for the exam, diagnosis, procedures, and orders are all accurate and complete.   IF YOU HAVE BEEN REFERRED TO A SPECIALIST, IT MAY TAKE 1-2 WEEKS TO SCHEDULE/PROCESS THE REFERRAL. IF YOU HAVE NOT HEARD FROM US/SPECIALIST IN TWO WEEKS, PLEASE GIVE [place] A CALL AT  [phone].   THE PATIENT IS ENCOURAGED TO PRACTICE SOCIAL DISTANCING DUE TO THE COVID-19 PANDEMIC.

## 2020-11-13 ENCOUNTER — Other Ambulatory Visit: Payer: Self-pay | Admitting: Nurse Practitioner

## 2020-11-13 DIAGNOSIS — Z1231 Encounter for screening mammogram for malignant neoplasm of breast: Secondary | ICD-10-CM

## 2021-01-03 ENCOUNTER — Ambulatory Visit
Admission: RE | Admit: 2021-01-03 | Discharge: 2021-01-03 | Disposition: A | Discharge status: Home / Self Care | Payer: 59 | Source: Ambulatory Visit | Attending: Nurse Practitioner | Admitting: Nurse Practitioner

## 2021-01-03 ENCOUNTER — Other Ambulatory Visit: Payer: Self-pay

## 2021-01-03 DIAGNOSIS — Z1231 Encounter for screening mammogram for malignant neoplasm of breast: Secondary | ICD-10-CM

## 2021-01-11 ENCOUNTER — Encounter: Payer: BC Managed Care – PPO | Admitting: Nurse Practitioner

## 2021-01-11 ENCOUNTER — Other Ambulatory Visit: Payer: Self-pay

## 2021-01-11 NOTE — Progress Notes (Deleted)
I,Yamilka Roman [employer] as a Education administrator for [employer], FNP.,have documented all relevant documentation on the behalf of [name], FNP,as directed by  [name], FNP while in the presence of [name], [place].  This visit occurred during the SARS-CoV-2 public health emergency.  Safety protocols were in place, including screening questions prior to the visit, additional usage of staff PPE, and extensive cleaning of exam room while observing appropriate contact time as indicated for disinfecting solutions.  Subjective:     Patient ID: Frances Estrada , female    DOB: [birth year] , 50 y.o.   MRN: [id number]   No chief complaint on file.   HPI  Patient presents today for an anxiety f/u.    Anxiety    Insomnia Primary symptoms: no fragmented sleep, sleep disturbance.   The current episode started more than one year. The onset quality is gradual. The problem occurs intermittently. The problem has been waxing and waning since onset. The symptoms are aggravated by anxiety and SSRI use. How many beverages per day that contain caffeine: 2-3.  Types of beverages you drink: soda. Past treatments include alcohol. Typical bedtime:  10-11 P.M..  PMH includes: hypertension.     Past Medical History:  Diagnosis Date   Hypertension      No family history on file.   Current Outpatient Medications:    ALPRAZolam (XANAX) 0.25 MG tablet, Take 1 tablet (0.25 mg total) by mouth 2 (two) times daily as needed., Disp: 30 tablet, Rfl: 0   blood glucose meter kit and supplies KIT, Dispense based on patient and insurance preference. Use once daily as directed. (FOR ICD-10 R73.09). (Patient not taking: No sig reported), Disp: 1 each, Rfl: 0   buPROPion (WELLBUTRIN XL) 150 MG 24 hr tablet, Take 1 tablet (150 mg total) by mouth every morning., Disp: 90 tablet, Rfl: 1   diltiazem (CARDIZEM) 120 MG tablet, Take 1 tablet (120 mg total) by mouth daily., Disp: 90 tablet, Rfl: 1    lisinopril-hydrochlorothiazide (ZESTORETIC) 10-12.5 MG tablet, Take 1 tablet by mouth daily., Disp: 90 tablet, Rfl: 0   Magnesium 250 MG TABS, Take 1 tablet (250 mg total) by mouth daily., Disp: 90 tablet, Rfl: 1   metFORMIN (GLUCOPHAGE XR) 750 MG 24 hr tablet, Take 1 tablet (750 mg total) by mouth daily with breakfast., Disp: 90 tablet, Rfl: 1   Vitamin D, Ergocalciferol, (DRISDOL) 1.25 MG (50000 UNIT) CAPS capsule, Take 1 capsule (50,000 Units total) by mouth 2 (two) times a week., Disp: 24 capsule, Rfl: 1   No Known Allergies   Review of Systems  Psychiatric/Behavioral:  Positive for sleep disturbance.     There were no vitals filed for this visit. There is no height or weight on file to calculate BMI.   Objective:  Physical Exam      Assessment And Plan:     1. Anxiety  2. Prediabetes     Patient was given opportunity to ask questions. Patient verbalized understanding of the plan and was able to repeat key elements of the plan. All questions were answered to their satisfaction.  [address] [place], CMA   I, [place], [place], have reviewed all documentation for this visit. The documentation on 01/11/21 for the exam, diagnosis, procedures, and orders are all accurate and complete.   IF YOU HAVE BEEN REFERRED TO A SPECIALIST, IT MAY TAKE 1-2 WEEKS TO SCHEDULE/PROCESS THE REFERRAL. IF YOU HAVE NOT HEARD FROM US/SPECIALIST IN TWO WEEKS, PLEASE GIVE [place] A CALL  AT [phone].   THE PATIENT IS ENCOURAGED TO PRACTICE SOCIAL DISTANCING DUE TO THE COVID-19 PANDEMIC.

## 2021-05-14 ENCOUNTER — Other Ambulatory Visit: Payer: Self-pay | Admitting: Nurse Practitioner

## 2021-05-19 ENCOUNTER — Other Ambulatory Visit: Payer: Self-pay | Admitting: Nurse Practitioner

## 2021-05-30 ENCOUNTER — Encounter: Payer: Self-pay | Admitting: Nurse Practitioner

## 2021-05-30 ENCOUNTER — Other Ambulatory Visit: Payer: Self-pay

## 2021-05-30 ENCOUNTER — Ambulatory Visit (INDEPENDENT_AMBULATORY_CARE_PROVIDER_SITE_OTHER): Payer: BC Managed Care – PPO | Admitting: Nurse Practitioner

## 2021-05-30 VITALS — BP 130/80 | HR 89 | Temp 100.0°F | Ht 67.8 in | Wt 217.4 lb

## 2021-05-30 DIAGNOSIS — Z1211 Encounter for screening for malignant neoplasm of colon: Secondary | ICD-10-CM

## 2021-05-30 DIAGNOSIS — E6609 Other obesity due to excess calories: Secondary | ICD-10-CM | POA: Diagnosis not present

## 2021-05-30 DIAGNOSIS — I10 Essential (primary) hypertension: Secondary | ICD-10-CM

## 2021-05-30 DIAGNOSIS — R7303 Prediabetes: Secondary | ICD-10-CM

## 2021-05-30 DIAGNOSIS — Z6833 Body mass index (BMI) 33.0-33.9, adult: Secondary | ICD-10-CM

## 2021-05-30 DIAGNOSIS — F419 Anxiety disorder, unspecified: Secondary | ICD-10-CM

## 2021-05-30 DIAGNOSIS — F3289 Other specified depressive episodes: Secondary | ICD-10-CM

## 2021-05-30 DIAGNOSIS — E66811 Obesity, class 1: Secondary | ICD-10-CM

## 2021-05-30 DIAGNOSIS — Z2821 Immunization not carried out because of patient refusal: Secondary | ICD-10-CM

## 2021-05-30 LAB — CMP14+EGFR
ALT: 8 IU/L (ref 0–32)
AST: 12 IU/L (ref 0–40)
Albumin/Globulin Ratio: 1.7 (ref 1.2–2.2)
Albumin: 4.9 g/dL — ABNORMAL HIGH (ref 3.8–4.8)
Alkaline Phosphatase: 67 IU/L (ref 44–121)
BUN/Creatinine Ratio: 10 (ref 9–23)
BUN: 7 mg/dL (ref 6–24)
Bilirubin Total: 0.4 mg/dL (ref 0.0–1.2)
CO2: 23 mmol/L (ref 20–29)
Calcium: 9.8 mg/dL (ref 8.7–10.2)
Chloride: 100 mmol/L (ref 96–106)
Creatinine, Ser: 0.67 mg/dL (ref 0.57–1.00)
Globulin, Total: 2.9 g/dL (ref 1.5–4.5)
Glucose: 100 mg/dL — ABNORMAL HIGH (ref 70–99)
Potassium: 3.8 mmol/L (ref 3.5–5.2)
Sodium: 136 mmol/L (ref 134–144)
Total Protein: 7.8 g/dL (ref 6.0–8.5)
eGFR: 106 mL/min/{1.73_m2} (ref >59)

## 2021-05-30 LAB — HEMOGLOBIN A1C
Est. average glucose Bld gHb Est-mCnc: 131 mg/dL
Hgb A1c MFr Bld: 6.2 % — ABNORMAL HIGH (ref 4.8–5.6)

## 2021-05-30 MED ORDER — LISINOPRIL-HYDROCHLOROTHIAZIDE 10-12.5 MG PO TABS: 1.0000 | ORAL_TABLET | Freq: Every day | ORAL | 1 refills | Status: DC

## 2021-05-30 MED ORDER — RYBELSUS 7 MG PO TABS: 1.0000 | ORAL_TABLET | Freq: Every day | ORAL | 0 refills | Status: DC

## 2021-05-30 NOTE — Patient Instructions (Signed)

## 2021-05-30 NOTE — Progress Notes (Signed)
I,Frances Estrada,acting as a Education administrator for [employer], FNP.,have documented all relevant documentation on the behalf of Frances Brine, FNP,as directed by  Frances Brine, FNP while in the presence of Frances Estrada, [place].  This visit occurred during the SARS-CoV-2 public health emergency.  Safety protocols were in place, including screening questions prior to the visit, additional usage of staff PPE, and extensive cleaning of exam room while observing appropriate contact time as indicated for disinfecting solutions.  Subjective:     Patient ID: Frances Estrada , female    DOB: February 13, 1971 , 50 y.o.   MRN: [id number]   Chief Complaint  Patient presents with   Hypertension    HPI  Patient is here for HTN follow up.  She is no longer taking the 750 mg of metformin. She has been cutting back on bad foods.   Wt Readings from Last 3 Encounters: 05/30/21 : 217 lb 6.4 oz (98.6 kg) 10/12/20 : 224 lb 6.4 oz (101.8 kg) 09/14/20 : 225 lb (102.1 kg)  She has stopped taking the buspirone due to feeling better since 2 of her co-workers resigned. She has been doing different coping mechanisms.   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  She began using propel.    Hypertension This is a chronic problem. The current episode started more than 1 year ago. The problem is unchanged. The problem is uncontrolled. Associated symptoms include anxiety. Pertinent negatives include no blurred vision, chest pain or palpitations. There are no associated agents to hypertension. Risk factors for coronary artery disease include obesity and sedentary lifestyle. Past treatments include diuretics. The current treatment provides no improvement. There are no compliance problems.  There is no history of angina. There is no history of chronic renal disease.  Anxiety Presents for follow-up visit. Symptoms include nervous/anxious behavior. Patient reports no chest  pain, depressed mood or palpitations.    Diabetes She presents for her follow-up diabetic visit. Diabetes type: prediabetes. Her disease course has been stable. Hypoglycemia symptoms include nervousness/anxiousness. Pertinent negatives for diabetes include no blurred vision, no chest pain, no fatigue, no polydipsia, no polyphagia and no polyuria. There are no hypoglycemic complications. Symptoms are stable. There are no diabetic complications. Risk factors for coronary artery disease include sedentary lifestyle and obesity. She is compliant with treatment all of the time. Diabetic current diet: low carb mostly. When asked about meal planning, she reported none. She has not had a previous visit with a dietitian. She rarely participates in exercise. An ACE inhibitor/angiotensin II receptor blocker is being taken. She does not see a podiatrist.Eye exam is current.    Past Medical History:  Diagnosis Date   Hypertension      History reviewed. No pertinent family history.   Current Outpatient Medications:    diltiazem (CARDIZEM) 120 MG tablet, Take 1 tablet (120 mg total) by mouth daily., Disp: 90 tablet, Rfl: 1   Magnesium 250 MG TABS, Take 1 tablet (250 mg total) by mouth daily., Disp: 90 tablet, Rfl: 1   Semaglutide (RYBELSUS) 7 MG TABS, Take 1 tablet by mouth daily. Take 30 minutes before breakfast, Disp: 90 tablet, Rfl: 0   Vitamin D, Ergocalciferol, (DRISDOL) 1.25 MG (50000 UNIT) CAPS capsule, Take 1 capsule (50,000 Units total) by mouth 2 (two) times a week., Disp: 24 capsule, Rfl: 1   ALPRAZolam (XANAX) 0.25 MG tablet, Take 1 tablet (0.25 mg total) by mouth 2 (two)  times daily as needed., Disp: 30 tablet, Rfl: 0   lisinopril-hydrochlorothiazide (ZESTORETIC) 10-12.5 MG tablet, Take 1 tablet by mouth daily., Disp: 90 tablet, Rfl: 1   No Known Allergies   Review of Systems  Constitutional: Negative.  Negative for fatigue.  Eyes:  Negative for blurred vision.  Respiratory: Negative.     Cardiovascular: Negative.  Negative for chest pain, palpitations and leg swelling.  Gastrointestinal: Negative.   Endocrine: Negative for polydipsia, polyphagia and polyuria.  Neurological: Negative.   Psychiatric/Behavioral:  The patient is nervous/anxious.     Today's Vitals   05/30/21 1017  BP: 130/80  Pulse: 89  Temp: 100 F (37.8 C)  TempSrc: Oral  Weight: 217 lb 6.4 oz (98.6 kg)  Height: 5' 7.8" (1.722 m)   Body mass index is 33.25 kg/m.  Wt Readings from Last 3 Encounters:  05/30/21 217 lb 6.4 oz (98.6 kg)  10/12/20 224 lb 6.4 oz (101.8 kg)  09/14/20 225 lb (102.1 kg)    Objective:  Physical Exam Vitals reviewed.  Constitutional:      General: She is not in acute distress.    Appearance: Normal appearance. She is obese.  Cardiovascular:     Rate and Rhythm: Normal rate and regular rhythm.     Pulses: Normal pulses.     Heart sounds: Normal heart sounds. No murmur heard. Pulmonary:     Effort: Pulmonary effort is normal. No respiratory distress.     Breath sounds: Normal breath sounds. No wheezing.  Skin:    Capillary Refill: Capillary refill takes less than 2 seconds.  Neurological:     General: No focal deficit present.     Mental Status: She is alert and oriented to person, place, and time.     Cranial Nerves: No cranial nerve deficit.  Psychiatric:        Attention and Perception: Attention and perception normal.        Mood and Affect: Mood and affect normal. Mood is not anxious.        Behavior: Behavior normal.        Thought Content: Thought content normal.        Cognition and Memory: Cognition normal.        Judgment: Judgment normal.        Assessment And Plan:     1. Essential hypertension Comments: Blood pressure is fairly controlled. Continue current medications.  - CMP14+EGFR  2. Prediabetes Comments: She was unable to tolerate metformin will try to get Rybelsus approved, sample given in office. Discussed side effects to include  nausea - Hemoglobin A1c - CMP14+EGFR - Semaglutide (RYBELSUS) 7 MG TABS; Take 1 tablet by mouth daily. Take 30 minutes before breakfast  Dispense: 90 tablet; Refill: 0  3. Anxiety Comments: Better controlled since some changes at her job.   4. Other depression Comments: Improved since her last visit.   5. Influenza vaccination declined Patient declined influenza vaccination at this time. Patient is aware that influenza vaccine prevents illness in 70% of healthy people, and reduces hospitalizations to 30-70% in elderly. This vaccine is recommended annually. Pt is willing to accept risk associated with refusing vaccination.  6. COVID-19 vaccination declined Declines covid 19 vaccine. Discussed risk of covid 54 and if she changes her mind about the vaccine to call the office.  Encouraged to take multivitamin, vitamin d, vitamin c and zinc to increase immune system. Aware can call office if would like to have vaccine here at office.   7. Class  1 obesity due to excess calories with serious comorbidity and body mass index (BMI) of 33.0 to 33.9 in adult  8. Encounter for screening colonoscopy Will order cologuard if positive will refer to GI for colonoscopy, denies family history of colon cancer - Cologuard  She is encouraged to strive for BMI less than 30 to decrease cardiac risk. Advised to aim for at least 150 minutes of exercise per week.    Patient was given opportunity to ask questions. Patient verbalized understanding of the plan and was able to repeat key elements of the plan. All questions were answered to their satisfaction.  Frances Brine, FNP   I, Frances Brine, FNP, have reviewed all documentation for this visit. The documentation on 05/30/21 for the exam, diagnosis, procedures, and orders are all accurate and complete.   IF YOU HAVE BEEN REFERRED TO A SPECIALIST, IT MAY TAKE 1-2 WEEKS TO SCHEDULE/PROCESS THE REFERRAL. IF YOU HAVE NOT HEARD FROM US/SPECIALIST IN TWO WEEKS, PLEASE  GIVE [place] A CALL AT [phone].   THE PATIENT IS ENCOURAGED TO PRACTICE SOCIAL DISTANCING DUE TO THE COVID-19 PANDEMIC.

## 2021-06-14 DIAGNOSIS — Z1211 Encounter for screening for malignant neoplasm of colon: Secondary | ICD-10-CM | POA: Diagnosis not present

## 2021-06-14 LAB — COLOGUARD: Cologuard: NEGATIVE

## 2021-06-18 ENCOUNTER — Other Ambulatory Visit: Payer: Self-pay

## 2021-06-18 NOTE — Telephone Encounter (Signed)
Prior auth done for the pt's Rybelsus. Waiting on a response from the pt's insurance company.

## 2021-06-20 LAB — COLOGUARD: COLOGUARD: NEGATIVE

## 2021-06-21 ENCOUNTER — Telehealth: Payer: Self-pay

## 2021-06-21 NOTE — Telephone Encounter (Signed)
Left pt vm to give the office a call back. Provider wants to notify Rybelsus keeps getting denied. Insurance continues to deny medication. Ms Christell Constant wants her to continue taking what she has & if willing see if she would like to take metformin.

## 2021-06-25 ENCOUNTER — Encounter: Payer: Self-pay | Admitting: Nurse Practitioner

## 2021-07-25 ENCOUNTER — Other Ambulatory Visit: Payer: Self-pay | Admitting: Nurse Practitioner

## 2021-07-25 DIAGNOSIS — R7303 Prediabetes: Secondary | ICD-10-CM

## 2021-07-25 MED ORDER — METFORMIN HCL ER 750 MG PO TB24: 750.0000 mg | ORAL_TABLET | Freq: Every day | ORAL | 2 refills | Status: DC

## 2021-08-12 ENCOUNTER — Other Ambulatory Visit: Payer: Self-pay | Admitting: Nurse Practitioner

## 2021-08-12 DIAGNOSIS — I10 Essential (primary) hypertension: Secondary | ICD-10-CM

## 2021-09-06 ENCOUNTER — Encounter: Payer: Self-pay | Admitting: Nurse Practitioner

## 2021-09-06 ENCOUNTER — Ambulatory Visit (INDEPENDENT_AMBULATORY_CARE_PROVIDER_SITE_OTHER): Payer: BC Managed Care – PPO | Admitting: Nurse Practitioner

## 2021-09-06 ENCOUNTER — Other Ambulatory Visit: Payer: Self-pay

## 2021-09-06 VITALS — BP 122/68 | HR 89 | Temp 98.5°F | Ht 67.8 in | Wt 221.0 lb

## 2021-09-06 DIAGNOSIS — Z0000 Encounter for general adult medical examination without abnormal findings: Secondary | ICD-10-CM

## 2021-09-06 DIAGNOSIS — R7303 Prediabetes: Secondary | ICD-10-CM | POA: Diagnosis not present

## 2021-09-06 DIAGNOSIS — Z114 Encounter for screening for human immunodeficiency virus [HIV]: Secondary | ICD-10-CM

## 2021-09-06 DIAGNOSIS — F419 Anxiety disorder, unspecified: Secondary | ICD-10-CM

## 2021-09-06 DIAGNOSIS — E559 Vitamin D deficiency, unspecified: Secondary | ICD-10-CM | POA: Diagnosis not present

## 2021-09-06 DIAGNOSIS — Z23 Encounter for immunization: Secondary | ICD-10-CM

## 2021-09-06 DIAGNOSIS — E6609 Other obesity due to excess calories: Secondary | ICD-10-CM

## 2021-09-06 DIAGNOSIS — I10 Essential (primary) hypertension: Secondary | ICD-10-CM | POA: Diagnosis not present

## 2021-09-06 DIAGNOSIS — Z79899 Other long term (current) drug therapy: Secondary | ICD-10-CM | POA: Diagnosis not present

## 2021-09-06 DIAGNOSIS — Z6833 Body mass index (BMI) 33.0-33.9, adult: Secondary | ICD-10-CM

## 2021-09-06 LAB — POCT URINALYSIS DIPSTICK
Bilirubin, UA: NEGATIVE
Blood, UA: NEGATIVE
Glucose, UA: NEGATIVE
Ketones, UA: NEGATIVE
Leukocytes, UA: NEGATIVE
Nitrite, UA: NEGATIVE
Protein, UA: NEGATIVE
Spec Grav, UA: 1.02 (ref 1.010–1.025)
Urobilinogen, UA: 1 E.U./dL
pH, UA: 7 (ref 5.0–8.0)

## 2021-09-06 MED ORDER — VITAMIN D (ERGOCALCIFEROL) 1.25 MG (50000 UNIT) PO CAPS: 50000.0000 [IU] | ORAL_CAPSULE | ORAL | 1 refills | Status: DC

## 2021-09-06 MED ORDER — METFORMIN HCL ER 750 MG PO TB24: 750.0000 mg | ORAL_TABLET | Freq: Every day | ORAL | 1 refills | Status: DC

## 2021-09-06 NOTE — Patient Instructions (Signed)

## 2021-09-06 NOTE — Progress Notes (Signed)
I,[name],acting as a Education administrator for [employer], FNP.,have documented all relevant documentation on the behalf of [name], FNP,as directed by  [name], FNP while in the presence of [name], [place].  This visit occurred during the SARS-CoV-2 public health emergency.  Safety protocols were in place, including screening questions prior to the visit, additional usage of staff PPE, and extensive cleaning of exam room while observing appropriate contact time as indicated for disinfecting solutions.  Subjective:     Patient ID: Frances Estrada , female    DOB: 1970/11/14 , 51 y.o.   MRN: [id number]   Chief Complaint  Patient presents with   Annual Exam    HPI  Patient is here for HTN follow up.    Wt Readings from Last 3 Encounters: 09/06/21 : 221 lb (100.2 kg) 05/30/21 : 217 lb 6.4 oz (98.6 kg) 10/12/20 : 224 lb 6.4 oz (101.8 kg)    Hypertension This is a chronic problem. The current episode started more than 1 year ago. The problem is unchanged. The problem is uncontrolled. Associated symptoms include anxiety. Pertinent negatives include no blurred vision, chest pain or palpitations. There are no associated agents to hypertension. Risk factors for coronary artery disease include obesity and sedentary lifestyle. Past treatments include diuretics. The current treatment provides no improvement. There are no compliance problems.  There is no history of angina. There is no history of chronic renal disease.  Anxiety Presents for follow-up visit. Symptoms include nervous/anxious behavior. Patient reports no chest pain, depressed mood or palpitations.    Diabetes She presents for her follow-up diabetic visit. Diabetes type: prediabetes. Her disease course has been stable. Hypoglycemia symptoms include nervousness/anxiousness. Pertinent negatives for diabetes include no blurred vision, no chest pain, no fatigue, no polydipsia, no polyphagia and no polyuria. There are no  hypoglycemic complications. Symptoms are stable. There are no diabetic complications. Risk factors for coronary artery disease include sedentary lifestyle and obesity. She is compliant with treatment all of the time. Diabetic current diet: low carb mostly. When asked about meal planning, she reported none. She has not had a previous visit with a dietitian. She rarely participates in exercise. An ACE inhibitor/angiotensin II receptor blocker is being taken. She does not see a podiatrist.Eye exam is current.    Past Medical History:  Diagnosis Date   Hypertension      History reviewed. No pertinent family history.   Current Outpatient Medications:    ALPRAZolam (XANAX) 0.25 MG tablet, Take 1 tablet (0.25 mg total) by mouth 2 (two) times daily as needed., Disp: 30 tablet, Rfl: 0   diltiazem (CARDIZEM) 120 MG tablet, TAKE ONE TABLET BY MOUTH ONE TIME DAILY, Disp: 90 tablet, Rfl: 0   lisinopril-hydrochlorothiazide (ZESTORETIC) 10-12.5 MG tablet, Take 1 tablet by mouth daily., Disp: 90 tablet, Rfl: 1   Magnesium 250 MG TABS, Take 1 tablet (250 mg total) by mouth daily., Disp: 90 tablet, Rfl: 1   metFORMIN (GLUCOPHAGE XR) 750 MG 24 hr tablet, Take 1 tablet (750 mg total) by mouth daily with breakfast., Disp: 90 tablet, Rfl: 1   Vitamin D, Ergocalciferol, (DRISDOL) 1.25 MG (50000 UNIT) CAPS capsule, Take 1 capsule (50,000 Units total) by mouth 2 (two) times a week., Disp: 24 capsule, Rfl: 1   No Known Allergies   The patient states she is status post hysterectomy.   No LMP recorded. Patient has had a hysterectomy.. Negative for Dysmenorrhea and Negative for Menorrhagia. Negative for: breast discharge, breast lump(s), breast pain and  breast self exam. Associated symptoms include abnormal vaginal bleeding. Pertinent negatives include abnormal bleeding (hematology), anxiety, decreased libido, depression, difficulty falling sleep, dyspareunia, history of infertility, nocturia, sexual dysfunction, sleep  disturbances, urinary incontinence, urinary urgency, vaginal discharge and vaginal itching. Diet regular; she is cutting back on sugar. She has being doing better with her sugar intake. She has been drinking Dr. Malachi Bonds.  The patient states her exercise level is minimal - not on a regular basis. She has been walking the dog.   The patient's tobacco use is:  Social History   Tobacco Use  Smoking Status Never  Smokeless Tobacco Never   She has been exposed to passive smoke. The patient's alcohol use is:  Social History   Substance and Sexual Activity  Alcohol Use Yes   Comment: social    Review of Systems  Constitutional: Negative.  Negative for fatigue.  Eyes:  Negative for blurred vision.  Respiratory: Negative.    Cardiovascular: Negative.  Negative for chest pain and palpitations.  Gastrointestinal: Negative.   Endocrine: Negative for polydipsia, polyphagia and polyuria.  Neurological: Negative.   Psychiatric/Behavioral:  The patient is nervous/anxious.     Today's Vitals   09/06/21 1134  BP: 122/68  Pulse: 89  Temp: 98.5 F (36.9 C)  TempSrc: Oral  Weight: 221 lb (100.2 kg)  Height: 5' 7.8" (1.722 m)   Body mass index is 33.8 kg/m.  Wt Readings from Last 3 Encounters:  09/06/21 221 lb (100.2 kg)  05/30/21 217 lb 6.4 oz (98.6 kg)  10/12/20 224 lb 6.4 oz (101.8 kg)    Objective:  Physical Exam Vitals reviewed.  Constitutional:      General: She is not in acute distress.    Appearance: Normal appearance. She is well-developed. She is obese.  HENT:     Head: Normocephalic and atraumatic.     Right Ear: Hearing, tympanic membrane, ear canal and external ear normal. There is no impacted cerumen.     Left Ear: Hearing, tympanic membrane, ear canal and external ear normal. There is no impacted cerumen.     Nose:     Comments: Deferred - masked    Mouth/Throat:     Comments: Deferred - masked Eyes:     General: Lids are normal.     Extraocular Movements:  Extraocular movements intact.     Pupils: Pupils are equal, round, and reactive to light.     Funduscopic exam:    Right eye: No papilledema.        Left eye: No papilledema.  Neck:     Thyroid: No thyroid mass.     Vascular: No carotid bruit.  Cardiovascular:     Rate and Rhythm: Normal rate and regular rhythm.     Pulses: Normal pulses.     Heart sounds: Normal heart sounds. No murmur heard. Pulmonary:     Effort: Pulmonary effort is normal. No respiratory distress.     Breath sounds: Normal breath sounds. No wheezing.  Chest:     Chest wall: No mass.  Breasts:    Tanner Score is 5.     Right: Normal. No mass or tenderness.     Left: Normal. No mass or tenderness.  Abdominal:     General: Abdomen is flat. Bowel sounds are normal. There is no distension.     Palpations: Abdomen is soft.     Tenderness: There is no abdominal tenderness.  Genitourinary:    Comments: Deferred - cologuard negative in November 2022 Musculoskeletal:  General: No swelling or tenderness. Normal range of motion.     Cervical back: Full passive range of motion without pain, normal range of motion and neck supple.     Right lower leg: No edema.     Left lower leg: No edema.  Lymphadenopathy:     Upper Body:     Right upper body: No supraclavicular, axillary or pectoral adenopathy.     Left upper body: No supraclavicular, axillary or pectoral adenopathy.  Skin:    General: Skin is warm and dry.     Capillary Refill: Capillary refill takes less than 2 seconds.  Neurological:     General: No focal deficit present.     Mental Status: She is alert and oriented to person, place, and time.     Cranial Nerves: No cranial nerve deficit.     Sensory: No sensory deficit.  Psychiatric:        Mood and Affect: Mood normal.        Behavior: Behavior normal.        Thought Content: Thought content normal.        Judgment: Judgment normal.        Assessment And Plan:     1. Encounter for annual  physical exam Behavior modifications discussed and diet history reviewed.   Pt will continue to exercise regularly and modify diet with low GI, plant based foods and decrease intake of processed foods.  Recommend intake of daily multivitamin, Vitamin D, and calcium.  Recommend mammogram (UTD) and cologuard (UTD) for preventive screenings, as well as recommend immunizations that include influenza, TDAP (she will call back with update on her tdap), and Shingles (1sr dose given in today in office)  2. Class 1 obesity due to excess calories with serious comorbidity and body mass index (BMI) of 33.0 to 33.9 in adult Chronic Discussed healthy diet and regular exercise options  Encouraged to exercise at least 150 minutes per week with 2 days of strength training  3. Essential hypertension Comments: Excellent control, continue current medications. EKG done with nonspecific t abnormality - POCT Urinalysis Dipstick (81002) - Microalbumin / Creatinine Urine Ratio - EKG 12-Lead  4. Prediabetes Comments: HgbA1c has been stable, she is to restart the metformin XR unable to get Rybelsus. Encouraged to continue regular exercise. - Hemoglobin A1c - CMP14+EGFR  5. Vitamin D deficiency - VITAMIN D 25 Hydroxy (Vit-D Deficiency, Fractures) - Vitamin D, Ergocalciferol, (DRISDOL) 1.25 MG (50000 UNIT) CAPS capsule; Take 1 capsule (50,000 Units total) by mouth 2 (two) times a week.  Dispense: 24 capsule; Refill: 1  6. Anxiety Comments: Overall well controlled, has not had a refill of Xanax since March 2022  7. Encounter for immunization - Varicella-zoster vaccine IM (Shingrix)  8. Encounter for HIV (human immunodeficiency virus) test - HIV Antibody (routine testing w rflx)  9. Other long term (current) drug therapy - CBC     Patient was given opportunity to ask questions. Patient verbalized understanding of the plan and was able to repeat key elements of the plan. All questions were answered to their  satisfaction.   [name], FNP   I, [name], FNP, have reviewed all documentation for this visit. The documentation on 09/06/21 for the exam, diagnosis, procedures, and orders are all accurate and complete.  THE PATIENT IS ENCOURAGED TO PRACTICE SOCIAL DISTANCING DUE TO THE COVID-19 PANDEMIC.

## 2021-09-07 LAB — CMP14+EGFR
ALT: 11 IU/L (ref 0–32)
AST: 13 IU/L (ref 0–40)
Albumin/Globulin Ratio: 1.8 (ref 1.2–2.2)
Albumin: 5.1 g/dL — ABNORMAL HIGH (ref 3.8–4.8)
Alkaline Phosphatase: 85 IU/L (ref 44–121)
BUN/Creatinine Ratio: 14 (ref 9–23)
BUN: 10 mg/dL (ref 6–24)
Bilirubin Total: 0.6 mg/dL (ref 0.0–1.2)
CO2: 24 mmol/L (ref 20–29)
Calcium: 9.6 mg/dL (ref 8.7–10.2)
Chloride: 100 mmol/L (ref 96–106)
Creatinine, Ser: 0.74 mg/dL (ref 0.57–1.00)
Globulin, Total: 2.9 g/dL (ref 1.5–4.5)
Glucose: 108 mg/dL — ABNORMAL HIGH (ref 70–99)
Potassium: 3.8 mmol/L (ref 3.5–5.2)
Sodium: 138 mmol/L (ref 134–144)
Total Protein: 8 g/dL (ref 6.0–8.5)
eGFR: 99 mL/min/{1.73_m2} (ref >59)

## 2021-09-07 LAB — CBC
Hematocrit: 39.2 % (ref 34.0–46.6)
Hemoglobin: 13.2 g/dL (ref 11.1–15.9)
MCH: 29.9 pg (ref 26.6–33.0)
MCHC: 33.7 g/dL (ref 31.5–35.7)
MCV: 89 fL (ref 79–97)
Platelets: 261 10*3/uL (ref 150–450)
RBC: 4.41 x10E6/uL (ref 3.77–5.28)
RDW: 13.1 % (ref 11.7–15.4)
WBC: 4 10*3/uL (ref 3.4–10.8)

## 2021-09-07 LAB — MICROALBUMIN / CREATININE URINE RATIO
Creatinine, Urine: 108.4 mg/dL
Microalb/Creat Ratio: 6 mg/g creat (ref 0–29)
Microalbumin, Urine: 6.2 ug/mL

## 2021-09-07 LAB — HIV ANTIBODY (ROUTINE TESTING W REFLEX): HIV Screen 4th Generation wRfx: NONREACTIVE

## 2021-09-07 LAB — HEMOGLOBIN A1C
Est. average glucose Bld gHb Est-mCnc: 123 mg/dL
Hgb A1c MFr Bld: 5.9 % — ABNORMAL HIGH (ref 4.8–5.6)

## 2021-09-07 LAB — VITAMIN D 25 HYDROXY (VIT D DEFICIENCY, FRACTURES): Vit D, 25-Hydroxy: 20.4 ng/mL — ABNORMAL LOW (ref 30.0–100.0)

## 2021-11-18 ENCOUNTER — Other Ambulatory Visit: Payer: Self-pay | Admitting: Nurse Practitioner

## 2021-11-18 DIAGNOSIS — I10 Essential (primary) hypertension: Secondary | ICD-10-CM

## 2021-12-04 ENCOUNTER — Other Ambulatory Visit: Payer: Self-pay | Admitting: Nurse Practitioner

## 2022-01-10 ENCOUNTER — Ambulatory Visit (INDEPENDENT_AMBULATORY_CARE_PROVIDER_SITE_OTHER): Payer: BC Managed Care – PPO | Admitting: Nurse Practitioner

## 2022-01-10 ENCOUNTER — Encounter: Payer: Self-pay | Admitting: Nurse Practitioner

## 2022-01-10 VITALS — BP 130/68 | HR 89 | Temp 98.2°F | Ht 67.8 in | Wt 216.8 lb

## 2022-01-10 DIAGNOSIS — E6609 Other obesity due to excess calories: Secondary | ICD-10-CM

## 2022-01-10 DIAGNOSIS — R21 Rash and other nonspecific skin eruption: Secondary | ICD-10-CM

## 2022-01-10 DIAGNOSIS — F419 Anxiety disorder, unspecified: Secondary | ICD-10-CM

## 2022-01-10 DIAGNOSIS — E559 Vitamin D deficiency, unspecified: Secondary | ICD-10-CM | POA: Diagnosis not present

## 2022-01-10 DIAGNOSIS — E66811 Obesity, class 1: Secondary | ICD-10-CM

## 2022-01-10 DIAGNOSIS — R7303 Prediabetes: Secondary | ICD-10-CM

## 2022-01-10 DIAGNOSIS — I10 Essential (primary) hypertension: Secondary | ICD-10-CM

## 2022-01-10 DIAGNOSIS — Z6833 Body mass index (BMI) 33.0-33.9, adult: Secondary | ICD-10-CM

## 2022-01-10 DIAGNOSIS — F3289 Other specified depressive episodes: Secondary | ICD-10-CM

## 2022-01-10 MED ORDER — BUPROPION HCL ER (XL) 150 MG PO TB24: 150.0000 mg | ORAL_TABLET | ORAL | 1 refills | Status: DC

## 2022-01-10 NOTE — Patient Instructions (Signed)
Hypertension, Adult High blood pressure (hypertension) is when the force of blood pumping through the arteries is too strong. The arteries are the blood vessels that carry blood from the heart throughout the body. Hypertension forces the heart to work harder to pump blood and may cause arteries to become narrow or stiff. Untreated or uncontrolled hypertension can lead to a heart attack, heart failure, a stroke, kidney disease, and other problems. A blood pressure reading consists of a higher number over a lower number. Ideally, your blood pressure should be below 120/80. The first ("top") number is called the systolic pressure. It is a measure of the pressure in your arteries as your heart beats. The second ("bottom") number is called the diastolic pressure. It is a measure of the pressure in your arteries as the heart relaxes. What are the causes? The exact cause of this condition is not known. There are some conditions that result in high blood pressure. What increases the risk? Certain factors may make you more likely to develop high blood pressure. Some of these risk factors are under your control, including: Smoking. Not getting enough exercise or physical activity. Being overweight. Having too much fat, sugar, calories, or salt (sodium) in your diet. Drinking too much alcohol. Other risk factors include: Having a personal history of heart disease, diabetes, high cholesterol, or kidney disease. Stress. Having a family history of high blood pressure and high cholesterol. Having obstructive sleep apnea. Age. The risk increases with age. What are the signs or symptoms? High blood pressure may not cause symptoms. Very high blood pressure (hypertensive crisis) may cause: Headache. Fast or irregular heartbeats (palpitations). Shortness of breath. Nosebleed. Nausea and vomiting. Vision changes. Severe chest pain, dizziness, and seizures. How is this diagnosed? This condition is diagnosed by  measuring your blood pressure while you are seated, with your arm resting on a flat surface, your legs uncrossed, and your feet flat on the floor. The cuff of the blood pressure monitor will be placed directly against the skin of your upper arm at the level of your heart. Blood pressure should be measured at least twice using the same arm. Certain conditions can cause a difference in blood pressure between your right and left arms. If you have a high blood pressure reading during one visit or you have normal blood pressure with other risk factors, you may be asked to: Return on a different day to have your blood pressure checked again. Monitor your blood pressure at home for 1 week or longer. If you are diagnosed with hypertension, you may have other blood or imaging tests to help your health care provider understand your overall risk for other conditions. How is this treated? This condition is treated by making healthy lifestyle changes, such as eating healthy foods, exercising more, and reducing your alcohol intake. You may be referred for counseling on a healthy diet and physical activity. Your health care provider may prescribe medicine if lifestyle changes are not enough to get your blood pressure under control and if: Your systolic blood pressure is above 130. Your diastolic blood pressure is above 80. Your personal target blood pressure may vary depending on your medical conditions, your age, and other factors. Follow these instructions at home: Eating and drinking  Eat a diet that is high in fiber and potassium, and low in sodium, added sugar, and fat. An example of this eating plan is called the DASH diet. DASH stands for Dietary Approaches to Stop Hypertension. To eat this way: Eat   plenty of fresh fruits and vegetables. Try to fill one half of your plate at each meal with fruits and vegetables. Eat whole grains, such as whole-wheat pasta, brown rice, or whole-grain bread. Fill about one  fourth of your plate with whole grains. Eat or drink low-fat dairy products, such as skim milk or low-fat yogurt. Avoid fatty cuts of meat, processed or cured meats, and poultry with skin. Fill about one fourth of your plate with lean proteins, such as fish, chicken without skin, beans, eggs, or tofu. Avoid pre-made and processed foods. These tend to be higher in sodium, added sugar, and fat. Reduce your daily sodium intake. Many people with hypertension should eat less than 1,500 mg of sodium a day. Do not drink alcohol if: Your health care provider tells you not to drink. You are pregnant, may be pregnant, or are planning to become pregnant. If you drink alcohol: Limit how much you have to: 0-1 drink a day for women. 0-2 drinks a day for men. Know how much alcohol is in your drink. In the [place] one drink equals one 12 oz bottle of beer (355 mL), one 5 oz glass of wine (148 mL), or one 1 oz glass of hard liquor (44 mL). Lifestyle  Work with your health care provider to maintain a healthy body weight or to lose weight. Ask what an ideal weight is for you. Get at least 30 minutes of exercise that causes your heart to beat faster (aerobic exercise) most days of the week. Activities may include walking, swimming, or biking. Include exercise to strengthen your muscles (resistance exercise), such as Pilates or lifting weights, as part of your weekly exercise routine. Try to do these types of exercises for 30 minutes at least 3 days a week. Do not use any products that contain nicotine or tobacco. These products include cigarettes, chewing tobacco, and vaping devices, such as e-cigarettes. If you need help quitting, ask your health care provider. Monitor your blood pressure at home as told by your health care provider. Keep all follow-up visits. This is important. Medicines Take over-the-counter and prescription medicines only as told by your health care provider. Follow directions carefully. Blood  pressure medicines must be taken as prescribed. Do not skip doses of blood pressure medicine. Doing this puts you at risk for problems and can make the medicine less effective. Ask your health care provider about side effects or reactions to medicines that you should watch for. Contact a health care provider if you: Think you are having a reaction to a medicine you are taking. Have headaches that keep coming back (recurring). Feel dizzy. Have swelling in your ankles. Have trouble with your vision. Get help right away if you: Develop a severe headache or confusion. Have unusual weakness or numbness. Feel faint. Have severe pain in your chest or abdomen. Vomit repeatedly. Have trouble breathing. These symptoms may be an emergency. Get help right away. Call 911. Do not wait to see if the symptoms will go away. Do not drive yourself to the hospital. Summary Hypertension is when the force of blood pumping through your arteries is too strong. If this condition is not controlled, it may put you at risk for serious complications. Your personal target blood pressure may vary depending on your medical conditions, your age, and other factors. For most people, a normal blood pressure is less than 120/80. Hypertension is treated with lifestyle changes, medicines, or a combination of both. Lifestyle changes include losing weight, eating a healthy,   low-sodium diet, exercising more, and limiting alcohol. This information is not intended to replace advice given to you by your health care provider. Make sure you discuss any questions you have with your health care provider. Document Revised: 05/08/2021 Document Reviewed: 05/08/2021 Elsevier Patient Education  2023 Elsevier Inc.  

## 2022-01-10 NOTE — Progress Notes (Signed)
I,[name],acting as a Neurosurgeon for [employer], FNP.,have documented all relevant documentation on the behalf of Arnette Felts, FNP,as directed by  Arnette Felts, FNP while in the presence of Arnette Felts, FNP..  Subjective:     Patient ID: Frances Estrada , female    DOB: 1970/07/17 , 51 y.o.   MRN: [id number]   Chief Complaint  Patient presents with   Hypertension    HPI  Patient presents today for a bp check. Patient states she went to the beach and in April 17th and returned on the 20th and had severe itching, the scratching lead to bruising and redness skin. Wants to talk about possible allergic reaction. Patient had itching after going to the beach for about 2 weeks that developed into a bruise. She had been taking benadryl nightly. She had a rash to her left side of her neck and both sides of her hip. She had used a decorative soap prior to going to [employer]. She thought was related to detergent gain. She had been taking claritin and calamine lotion as well.   Patient also has a lump in her arm she states appeared after getting her last bp check.   She had been eating sugar and had stomach cramps and having to have a bowel movement for about 3 days. Notices more on the weekend. Also noticed when she drank two sodas.   Wt Readings from Last 3 Encounters: 01/10/22 : 216 lb 12.8 oz (98.3 kg) 09/06/21 : 221 lb (100.2 kg) 05/30/21 : 217 lb 6.4 oz (98.6 kg)  She does have an elliptical, exercise ball and resistance bands. Has been increasing her water intake.      Past Medical History:  Diagnosis Date   Hypertension      History reviewed. No pertinent family history.   Current Outpatient Medications:    ALPRAZolam (XANAX) 0.25 MG tablet, Take 1 tablet (0.25 mg total) by mouth 2 (two) times daily as needed., Disp: 30 tablet, Rfl: 0   diltiazem (CARDIZEM) 120 MG tablet, TAKE ONE TABLET BY MOUTH ONE TIME DAILY, Disp: 90 tablet, Rfl: 0   lisinopril-hydrochlorothiazide  (ZESTORETIC) 10-12.5 MG tablet, TAKE ONE TABLET BY MOUTH ONE TIME DAILY, Disp: 90 tablet, Rfl: 0   Magnesium 250 MG TABS, Take 1 tablet (250 mg total) by mouth daily., Disp: 90 tablet, Rfl: 1   Vitamin D, Ergocalciferol, (DRISDOL) 1.25 MG (50000 UNIT) CAPS capsule, Take 1 capsule (50,000 Units total) by mouth 2 (two) times a week., Disp: 24 capsule, Rfl: 1   buPROPion (WELLBUTRIN XL) 150 MG 24 hr tablet, Take 1 tablet (150 mg total) by mouth every morning., Disp: 90 tablet, Rfl: 1   metFORMIN (GLUCOPHAGE XR) 750 MG 24 hr tablet, Take 1 tablet (750 mg total) by mouth daily with breakfast. (Patient not taking: Reported on 01/10/2022), Disp: 90 tablet, Rfl: 1   No Known Allergies   Review of Systems  Constitutional: Negative.   Respiratory: Negative.    Cardiovascular: Negative.   Neurological:  Negative for dizziness and headaches.  Psychiatric/Behavioral: Negative.       Today's Vitals   01/10/22 1604  BP: 130/68  Pulse: 89  Temp: 98.2 F (36.8 C)  TempSrc: Oral  Weight: 216 lb 12.8 oz (98.3 kg)  Height: 5' 7.8" (1.722 m)   Body mass index is 33.16 kg/m.   Objective:  Physical Exam Vitals reviewed.  Constitutional:      General: She is not in acute distress.    Appearance:  Normal appearance. She is obese.  Cardiovascular:     Rate and Rhythm: Normal rate and regular rhythm.     Pulses: Normal pulses.     Heart sounds: Normal heart sounds. No murmur heard. Pulmonary:     Effort: Pulmonary effort is normal. No respiratory distress.     Breath sounds: Normal breath sounds. No wheezing.  Skin:    General: Skin is warm and dry.     Capillary Refill: Capillary refill takes less than 2 seconds.     Comments: Palpable linear slightly raised are to epidermal layer of skin  Neurological:     General: No focal deficit present.     Mental Status: She is alert and oriented to person, place, and time.     Cranial Nerves: No cranial nerve deficit.     Motor: No weakness.   Psychiatric:        Attention and Perception: Attention and perception normal.        Mood and Affect: Mood and affect normal. Mood is not anxious.        Behavior: Behavior normal.        Thought Content: Thought content normal.        Cognition and Memory: Cognition normal.        Judgment: Judgment normal.         Assessment And Plan:     1. Essential hypertension Comments: Blood pressure is controlled, continue current medications.   2. Prediabetes Comments: HgbA1c is better since last visit, continue limiting intake of sugary foods and drinks.   3. Vitamin D deficiency Comments: Stable, continue vitamin d supplement pending labs  4. Anxiety Comments: Stable, occasional episodes but manageable with medication when she is taking regularly. Encouraged to remainon for at least 6 months then can reevaluate - buPROPion (WELLBUTRIN XL) 150 MG 24 hr tablet; Take 1 tablet (150 mg total) by mouth every morning.  Dispense: 90 tablet; Refill: 1  5. Other depression - buPROPion (WELLBUTRIN XL) 150 MG 24 hr tablet; Take 1 tablet (150 mg total) by mouth every morning.  Dispense: 90 tablet; Refill: 1  6. Rash and nonspecific skin eruption Comments: No rash present at this time. If returns recommend to schedule appt or send pic if within the next month.   7. Class 1 obesity due to excess calories with serious comorbidity and body mass index (BMI) of 33.0 to 33.9 in adult     Patient was given opportunity to ask questions. Patient verbalized understanding of the plan and was able to repeat key elements of the plan. All questions were answered to their satisfaction.  Arnette Felts, FNP   I, Arnette Felts, FNP, have reviewed all documentation for this visit. The documentation on 01/10/22 for the exam, diagnosis, procedures, and orders are all accurate and complete.   IF YOU HAVE BEEN REFERRED TO A SPECIALIST, IT MAY TAKE 1-2 WEEKS TO SCHEDULE/PROCESS THE REFERRAL. IF YOU HAVE NOT HEARD FROM  US/SPECIALIST IN TWO WEEKS, PLEASE GIVE [place] A CALL AT [phone].   THE PATIENT IS ENCOURAGED TO PRACTICE SOCIAL DISTANCING DUE TO THE COVID-19 PANDEMIC.

## 2022-02-28 IMAGING — MG MM DIGITAL SCREENING BILAT W/ TOMO AND CAD
8 series · 8 of 24 positions shown · non-contrast
Comparison: Previous exam(s).

CLINICAL DATA: Screening.

EXAM:
DIGITAL SCREENING BILATERAL MAMMOGRAM WITH TOMOSYNTHESIS AND CAD
TECHNIQUE: Bilateral screening digital craniocaudal and mediolateral oblique
mammograms were obtained. Bilateral screening digital breast
tomosynthesis was performed. The images were evaluated with
computer-aided detection.

[L CC synth-2D]
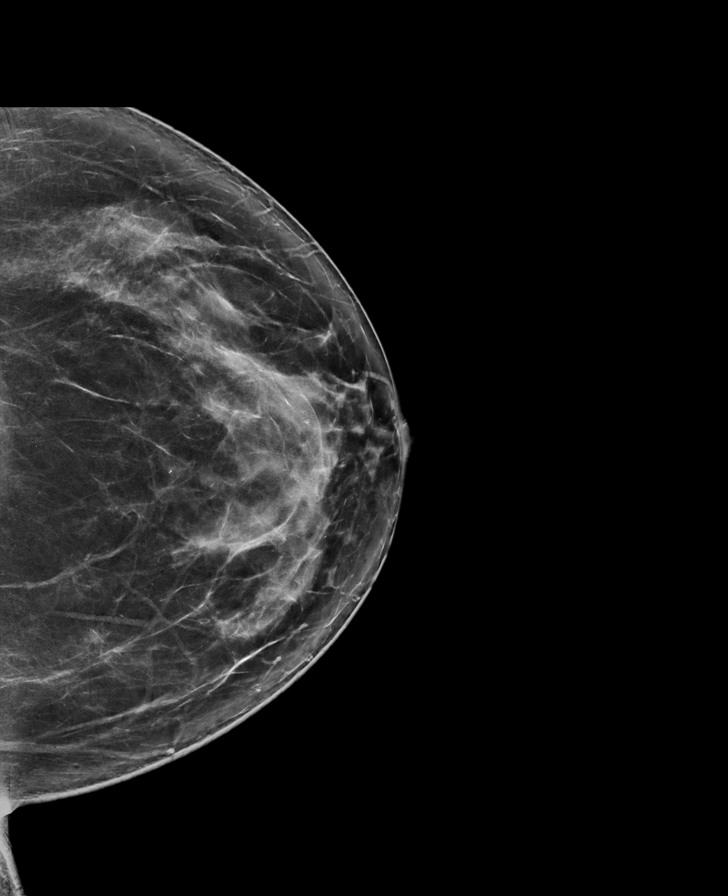

[R CC synth-2D]
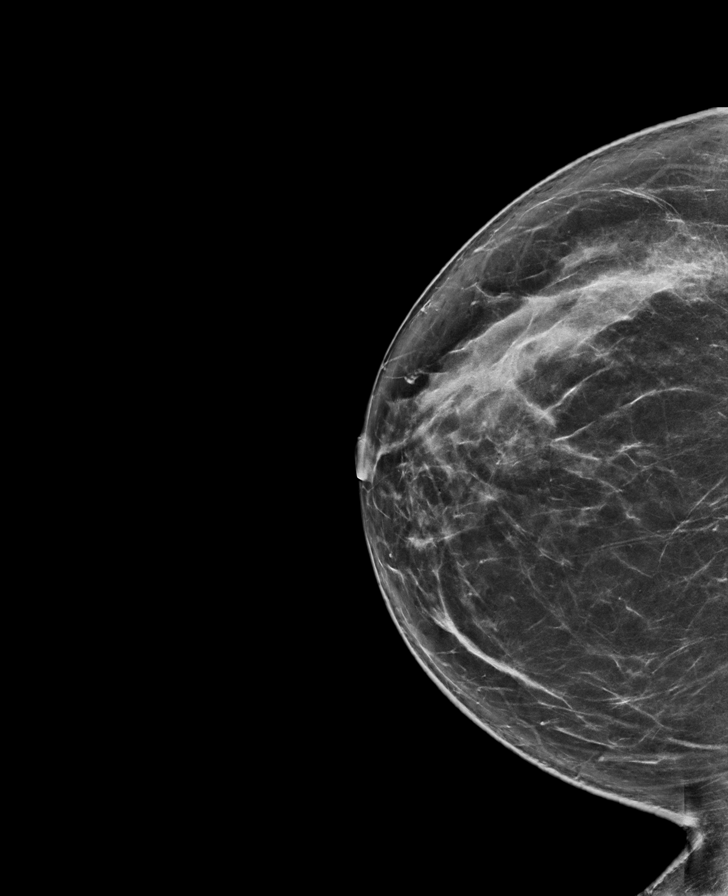

[L MLO synth-2D]
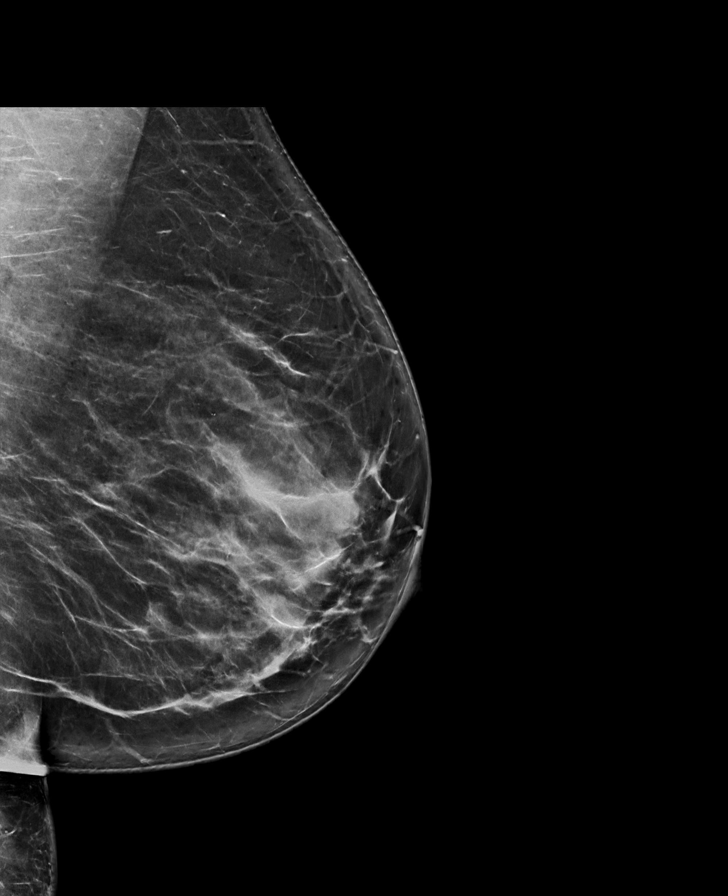

[R MLO synth-2D]
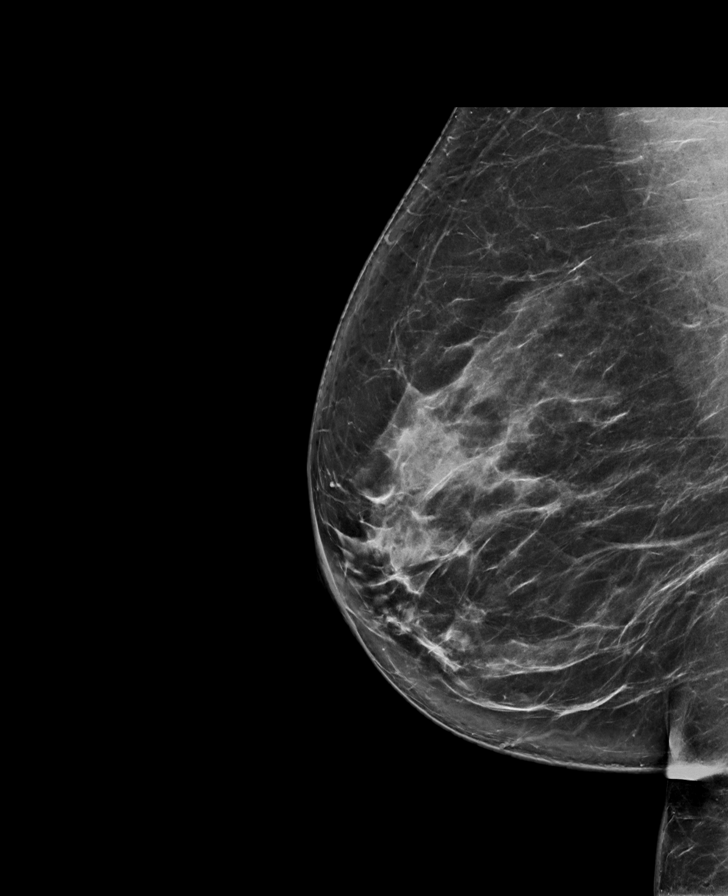

[L MLO tomo · tomo slice 45/90.0]
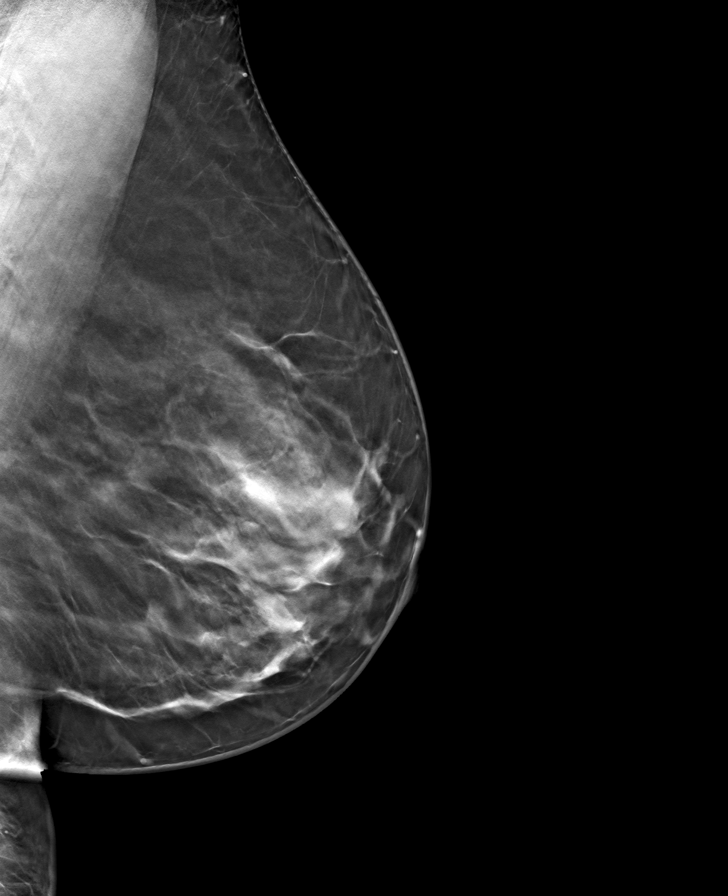

[R MLO tomo · tomo slice 45/90.0]
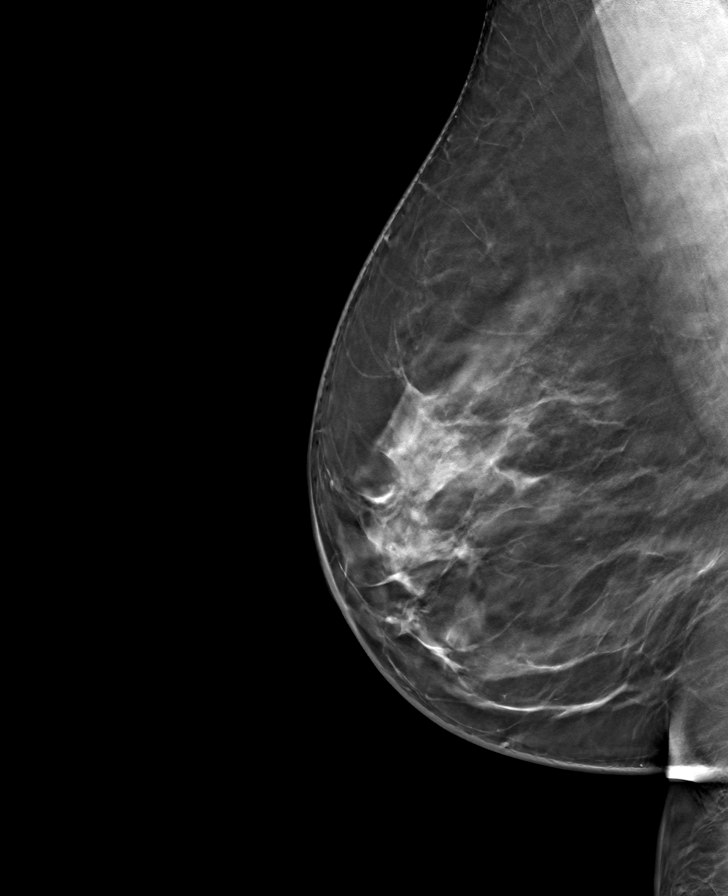

[L CC tomo · tomo slice 44/87.0]
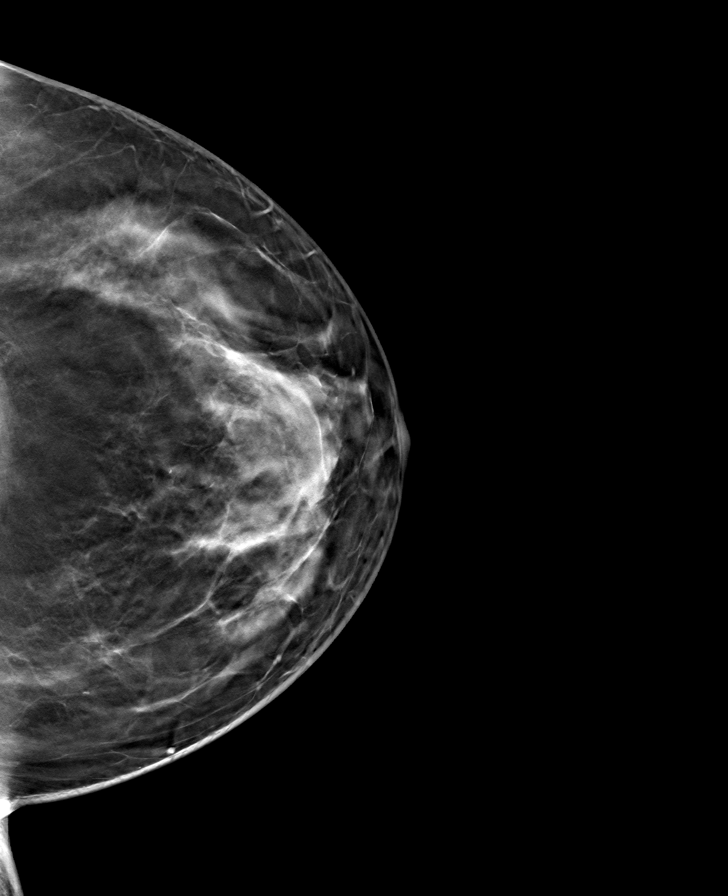

[R CC tomo · tomo slice 45/88.0]
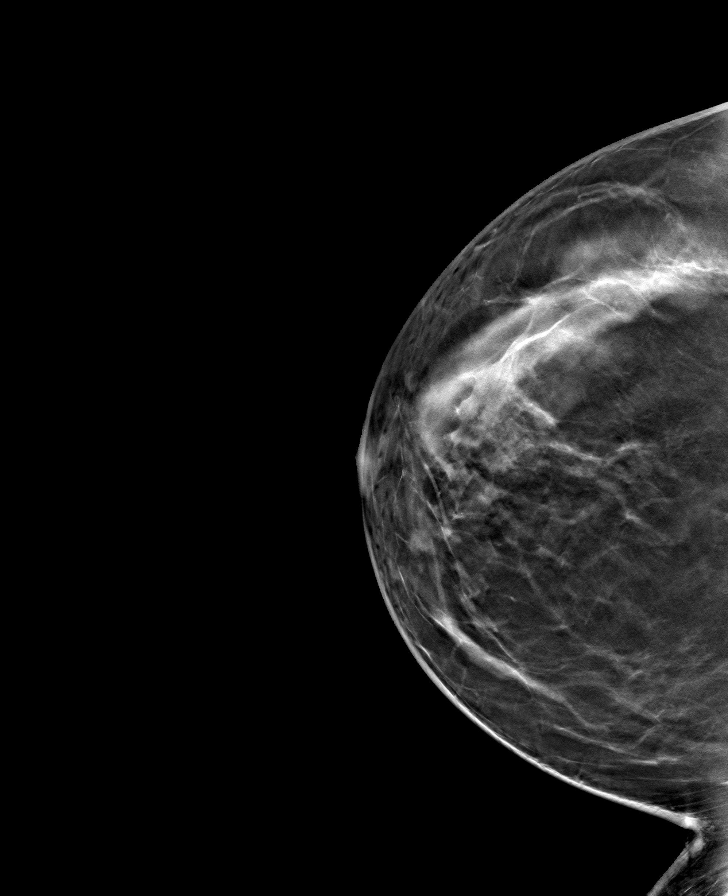

[8 of 24 positions shown; findings below may reference images not displayed]

ACR Breast Density Category c: The breast tissue is heterogeneously
dense, which may obscure small masses.
FINDINGS: There are no findings suspicious for malignancy.
IMPRESSION: No mammographic evidence of malignancy. A result letter of this
screening mammogram will be mailed directly to the patient.

RECOMMENDATION:
Screening mammogram in one year. (Code:Q3-W-BC3)

BI-RADS CATEGORY  1: Negative.

## 2022-05-14 ENCOUNTER — Ambulatory Visit (INDEPENDENT_AMBULATORY_CARE_PROVIDER_SITE_OTHER): Payer: BC Managed Care – PPO | Admitting: Nurse Practitioner

## 2022-05-14 ENCOUNTER — Encounter: Payer: Self-pay | Admitting: Nurse Practitioner

## 2022-05-14 VITALS — Temp 98.1°F | Ht 67.0 in | Wt 216.0 lb

## 2022-05-14 DIAGNOSIS — F419 Anxiety disorder, unspecified: Secondary | ICD-10-CM

## 2022-05-14 DIAGNOSIS — Z6833 Body mass index (BMI) 33.0-33.9, adult: Secondary | ICD-10-CM

## 2022-05-14 DIAGNOSIS — Z2821 Immunization not carried out because of patient refusal: Secondary | ICD-10-CM

## 2022-05-14 DIAGNOSIS — R42 Dizziness and giddiness: Secondary | ICD-10-CM

## 2022-05-14 DIAGNOSIS — R7303 Prediabetes: Secondary | ICD-10-CM

## 2022-05-14 DIAGNOSIS — E559 Vitamin D deficiency, unspecified: Secondary | ICD-10-CM | POA: Diagnosis not present

## 2022-05-14 DIAGNOSIS — I10 Essential (primary) hypertension: Secondary | ICD-10-CM

## 2022-05-14 DIAGNOSIS — F3289 Other specified depressive episodes: Secondary | ICD-10-CM

## 2022-05-14 DIAGNOSIS — E6609 Other obesity due to excess calories: Secondary | ICD-10-CM

## 2022-05-14 MED ORDER — VITAMIN D3 ULTRA STRENGTH 125 MCG (5000 UT) PO CAPS: 5000.0000 [IU] | ORAL_CAPSULE | Freq: Every day | ORAL | 1 refills | Status: AC

## 2022-05-14 MED ORDER — VALSARTAN-HYDROCHLOROTHIAZIDE 160-12.5 MG PO TABS: 1.0000 | ORAL_TABLET | Freq: Every day | ORAL | 1 refills | Status: DC

## 2022-05-14 NOTE — Progress Notes (Signed)
I,Frances Estrada,acting as a Education administrator for Frances Brine, FNP.,have documented all relevant documentation on the behalf of Frances Brine, FNP,as directed by  Frances Brine, FNP while in the presence of Frances Estrada, [place].  Subjective:     Patient ID: Frances Estrada , female    DOB: [birth year] , 51 y.o.   MRN: [id number]   Chief Complaint  Patient presents with   Hypertension    HPI  Patient presents today for BPC.   she would like to change her bp med. She feels that the med is not as effective.   Last week she had a episode where she had a craving for ramen noodles. She ate before clocking back into work. She felt dizzy after eating the noodles. She has had dizzy spells for the last 3-4 weeks which she feels uncomfortable about. The most recent time she felt dizzy, this past Wednesday. She could barley walk throughout her house, patient made way to her couch that day checked her BP, reading came out as 165/116. She rechecked after and was 140/92. She had forgotten to take her medication initially. Which patient states is really high for her, she has not had that reading for years.  Patient also reports feeling dizzy while sitting at work. She did not finish her noodles at that time. She is not drinking enough water.   Her dad is back living with her from out of town.      Past Medical History:  Diagnosis Date   Hypertension      History reviewed. No pertinent family history.   Current Outpatient Medications:    ALPRAZolam (XANAX) 0.25 MG tablet, Take 1 tablet (0.25 mg total) by mouth 2 (two) times daily as needed., Disp: 30 tablet, Rfl: 0   buPROPion (WELLBUTRIN XL) 150 MG 24 hr tablet, Take 1 tablet (150 mg total) by mouth every morning., Disp: 90 tablet, Rfl: 1   Cholecalciferol (VITAMIN D3 ULTRA STRENGTH) 125 MCG (5000 UT) capsule, Take 1 capsule (5,000 Units total) by mouth daily., Disp: 90 capsule, Rfl: 1   diltiazem (CARDIZEM) 120 MG tablet, TAKE ONE TABLET BY MOUTH ONE  TIME DAILY, Disp: 90 tablet, Rfl: 0   Magnesium 250 MG TABS, Take 1 tablet (250 mg total) by mouth daily., Disp: 90 tablet, Rfl: 1   valsartan-hydrochlorothiazide (DIOVAN HCT) 160-12.5 MG tablet, Take 1 tablet by mouth daily., Disp: 30 tablet, Rfl: 1   metFORMIN (GLUCOPHAGE XR) 750 MG 24 hr tablet, Take 1 tablet (750 mg total) by mouth daily with breakfast., Disp: 90 tablet, Rfl: 1   No Known Allergies   Review of Systems  Constitutional: Negative.   Respiratory: Negative.    Cardiovascular: Negative.   Gastrointestinal: Negative.   Skin: Negative.   Neurological:  Positive for dizziness.  Psychiatric/Behavioral: Negative.       Today's Vitals   05/14/22 1616  Temp: 98.1 F (36.7 C)  SpO2: 98%  Weight: 216 lb (98 kg)  Height: _0  (1.702 m)  PainSc: 0-No pain   Body mass index is 33.83 kg/m.  Wt Readings from Last 3 Encounters:  05/14/22 216 lb (98 kg)  01/10/22 216 lb 12.8 oz (98.3 kg)  09/06/21 221 lb (100.2 kg)    Objective:  Physical Exam Vitals reviewed.  Constitutional:      General: She is not in acute distress.    Appearance: Normal appearance. She is obese.  Cardiovascular:     Rate and Rhythm: Normal rate and regular rhythm.  Pulses: Normal pulses.     Heart sounds: Normal heart sounds. No murmur heard. Pulmonary:     Effort: Pulmonary effort is normal. No respiratory distress.     Breath sounds: Normal breath sounds. No wheezing.  Skin:    General: Skin is warm and dry.     Capillary Refill: Capillary refill takes less than 2 seconds.  Neurological:     General: No focal deficit present.     Mental Status: She is alert and oriented to person, place, and time.     Cranial Nerves: No cranial nerve deficit.     Motor: No weakness.  Psychiatric:        Attention and Perception: Attention and perception normal.        Mood and Affect: Mood and affect normal. Mood is not anxious.        Behavior: Behavior normal.        Thought Content: Thought  content normal.        Cognition and Memory: Cognition normal.        Judgment: Judgment normal.         Assessment And Plan:     1. Essential hypertension Comments: We will change her medications to valsartan/hctz. She is to f/u with a 6 week [place] blood pressure check. Eat a low salt diet.  - valsartan-hydrochlorothiazide (DIOVAN HCT) 160-12.5 MG tablet; Take 1 tablet by mouth daily.  Dispense: 30 tablet; Refill: 1 - BMP8+eGFR  2. Prediabetes Comments: HgbA1c is stable, continue current medications. Focus on healthy diet low in sugar and starches. Encouraged to get at least 150 minutes physical activity - Hemoglobin A1c  3. Vitamin D deficiency - Cholecalciferol (VITAMIN D3 ULTRA STRENGTH) 125 MCG (5000 UT) capsule; Take 1 capsule (5,000 Units total) by mouth daily.  Dispense: 90 capsule; Refill: 1  4. Anxiety Comments: Currently stable  5. Other depression Comments: Overall she is doing well  6. Influenza vaccination declined Patient declined influenza vaccination at this time. Patient is aware that influenza vaccine prevents illness in 70% of healthy people, and reduces hospitalizations to 30-70% in elderly. This vaccine is recommended annually. Education has been provided regarding the importance of this vaccine but patient still declined. Advised may receive this vaccine at local pharmacy or Health Dept.or vaccine clinic. Aware to provide a copy of the vaccination record if obtained from local pharmacy or Health Dept.  Pt is willing to accept risk associated with refusing vaccination.  7. Class 1 obesity due to excess calories without serious comorbidity with body mass index (BMI) of 33.0 to 33.9 in adult She is encouraged to strive for BMI less than 30 to decrease cardiac risk. Advised to aim for at least 150 minutes of exercise per week.   8. Dizziness Her dizziness may have been related to an elevated blood pressure.   Patient was given opportunity to ask questions. Patient  verbalized understanding of the plan and was able to repeat key elements of the plan. All questions were answered to their satisfaction.  Frances Brine, FNP   I, Frances Brine, FNP, have reviewed all documentation for this visit. The documentation on 05/14/22 for the exam, diagnosis, procedures, and orders are all accurate and complete.   IF YOU HAVE BEEN REFERRED TO A SPECIALIST, IT MAY TAKE 1-2 WEEKS TO SCHEDULE/PROCESS THE REFERRAL. IF YOU HAVE NOT HEARD FROM US/SPECIALIST IN TWO WEEKS, PLEASE GIVE [place] A CALL AT [phone].   THE PATIENT IS ENCOURAGED TO PRACTICE SOCIAL DISTANCING DUE  TO THE COVID-19 PANDEMIC.

## 2022-05-14 NOTE — Patient Instructions (Signed)
Hypertension, Adult ?Hypertension is another name for high blood pressure. High blood pressure forces your heart to work harder to pump blood. This can cause problems over time. ?There are two numbers in a blood pressure reading. There is a top number (systolic) over a bottom number (diastolic). It is best to have a blood pressure that is below 120/80. ?What are the causes? ?The cause of this condition is not known. Some other conditions can lead to high blood pressure. ?What increases the risk? ?Some lifestyle factors can make you more likely to develop high blood pressure: ?Smoking. ?Not getting enough exercise or physical activity. ?Being overweight. ?Having too much fat, sugar, calories, or salt (sodium) in your diet. ?Drinking too much alcohol. ?Other risk factors include: ?Having any of these conditions: ?Heart disease. ?Diabetes. ?High cholesterol. ?Kidney disease. ?Obstructive sleep apnea. ?Having a family history of high blood pressure and high cholesterol. ?Age. The risk increases with age. ?Stress. ?What are the signs or symptoms? ?High blood pressure may not cause symptoms. Very high blood pressure (hypertensive crisis) may cause: ?Headache. ?Fast or uneven heartbeats (palpitations). ?Shortness of breath. ?Nosebleed. ?Vomiting or feeling like you may vomit (nauseous). ?Changes in how you see. ?Very bad chest pain. ?Feeling dizzy. ?Seizures. ?How is this treated? ?This condition is treated by making healthy lifestyle changes, such as: ?Eating healthy foods. ?Exercising more. ?Drinking less alcohol. ?Your doctor may prescribe medicine if lifestyle changes do not help enough and if: ?Your top number is above 130. ?Your bottom number is above 80. ?Your personal target blood pressure may vary. ?Follow these instructions at home: ?Eating and drinking ? ?If told, follow the DASH eating plan. To follow this plan: ?Fill one half of your plate at each meal with fruits and vegetables. ?Fill one fourth of your plate  at each meal with whole grains. Whole grains include whole-wheat pasta, brown rice, and whole-grain bread. ?Eat or drink low-fat dairy products, such as skim milk or low-fat yogurt. ?Fill one fourth of your plate at each meal with low-fat (lean) proteins. Low-fat proteins include fish, chicken without skin, eggs, beans, and tofu. ?Avoid fatty meat, cured and processed meat, or chicken with skin. ?Avoid pre-made or processed food. ?Limit the amount of salt in your diet to less than 1,500 mg each day. ?Do not drink alcohol if: ?Your doctor tells you not to drink. ?You are pregnant, may be pregnant, or are planning to become pregnant. ?If you drink alcohol: ?Limit how much you have to: ?0-1 drink a day for women. ?0-2 drinks a day for men. ?Know how much alcohol is in your drink. In the [place] one drink equals one 12 oz bottle of beer (355 mL), one 5 oz glass of wine (148 mL), or one 1? oz glass of hard liquor (44 mL). ?Lifestyle ? ?Work with your doctor to stay at a healthy weight or to lose weight. Ask your doctor what the best weight is for you. ?Get at least 30 minutes of exercise that causes your heart to beat faster (aerobic exercise) most days of the week. This may include walking, swimming, or biking. ?Get at least 30 minutes of exercise that strengthens your muscles (resistance exercise) at least 3 days a week. This may include lifting weights or doing Pilates. ?Do not smoke or use any products that contain nicotine or tobacco. If you need help quitting, ask your doctor. ?Check your blood pressure at home as told by your doctor. ?Keep all follow-up visits. ?Medicines ?Take over-the-counter and prescription medicines   only as told by your doctor. Follow directions carefully. ?Do not skip doses of blood pressure medicine. The medicine does not work as well if you skip doses. Skipping doses also puts you at risk for problems. ?Ask your doctor about side effects or reactions to medicines that you should watch  for. ?Contact a doctor if: ?You think you are having a reaction to the medicine you are taking. ?You have headaches that keep coming back. ?You feel dizzy. ?You have swelling in your ankles. ?You have trouble with your vision. ?Get help right away if: ?You get a very bad headache. ?You start to feel mixed up (confused). ?You feel weak or numb. ?You feel faint. ?You have very bad pain in your: ?Chest. ?Belly (abdomen). ?You vomit more than once. ?You have trouble breathing. ?These symptoms may be an emergency. Get help right away. Call 911. ?Do not wait to see if the symptoms will go away. ?Do not drive yourself to the hospital. ?Summary ?Hypertension is another name for high blood pressure. ?High blood pressure forces your heart to work harder to pump blood. ?For most people, a normal blood pressure is less than 120/80. ?Making healthy choices can help lower blood pressure. If your blood pressure does not get lower with healthy choices, you may need to take medicine. ?This information is not intended to replace advice given to you by your health care provider. Make sure you discuss any questions you have with your health care provider. ?Document Revised: 04/19/2021 Document Reviewed: 04/19/2021 ?Elsevier Patient Education ? 2023 [employer] ? ?

## 2022-05-15 LAB — BMP8+EGFR
BUN/Creatinine Ratio: 11 (ref 9–23)
BUN: 8 mg/dL (ref 6–24)
CO2: 22 mmol/L (ref 20–29)
Calcium: 9.9 mg/dL (ref 8.7–10.2)
Chloride: 100 mmol/L (ref 96–106)
Creatinine, Ser: 0.74 mg/dL (ref 0.57–1.00)
Glucose: 85 mg/dL (ref 70–99)
Potassium: 3.7 mmol/L (ref 3.5–5.2)
Sodium: 137 mmol/L (ref 134–144)
eGFR: 99 mL/min/{1.73_m2} (ref >59)

## 2022-05-15 LAB — HEMOGLOBIN A1C
Est. average glucose Bld gHb Est-mCnc: 131 mg/dL
Hgb A1c MFr Bld: 6.2 % — ABNORMAL HIGH (ref 4.8–5.6)

## 2022-05-20 ENCOUNTER — Other Ambulatory Visit: Payer: Self-pay | Admitting: Nurse Practitioner

## 2022-05-20 MED ORDER — METFORMIN HCL ER 750 MG PO TB24: 750.0000 mg | ORAL_TABLET | Freq: Every day | ORAL | 1 refills | Status: DC

## 2022-06-05 ENCOUNTER — Other Ambulatory Visit: Payer: Self-pay | Admitting: Nurse Practitioner

## 2022-06-05 DIAGNOSIS — I10 Essential (primary) hypertension: Secondary | ICD-10-CM

## 2022-06-26 ENCOUNTER — Ambulatory Visit: Payer: BC Managed Care – PPO | Admitting: Nurse Practitioner

## 2022-08-01 ENCOUNTER — Encounter: Payer: Self-pay | Admitting: Nurse Practitioner

## 2022-08-01 ENCOUNTER — Other Ambulatory Visit: Payer: Self-pay | Admitting: Nurse Practitioner

## 2022-08-01 ENCOUNTER — Ambulatory Visit (INDEPENDENT_AMBULATORY_CARE_PROVIDER_SITE_OTHER): Payer: BC Managed Care – PPO | Admitting: Nurse Practitioner

## 2022-08-01 VITALS — BP 140/90 | HR 88 | Temp 98.4°F | Ht 67.0 in | Wt 220.0 lb

## 2022-08-01 DIAGNOSIS — F419 Anxiety disorder, unspecified: Secondary | ICD-10-CM

## 2022-08-01 DIAGNOSIS — I10 Essential (primary) hypertension: Secondary | ICD-10-CM

## 2022-08-01 DIAGNOSIS — Z2821 Immunization not carried out because of patient refusal: Secondary | ICD-10-CM

## 2022-08-01 DIAGNOSIS — F3289 Other specified depressive episodes: Secondary | ICD-10-CM | POA: Diagnosis not present

## 2022-08-01 DIAGNOSIS — M791 Myalgia, unspecified site: Secondary | ICD-10-CM | POA: Diagnosis not present

## 2022-08-01 MED ORDER — CYCLOBENZAPRINE HCL 10 MG PO TABS: 10.0000 mg | ORAL_TABLET | Freq: Three times a day (TID) | ORAL | 0 refills | Status: DC | PRN

## 2022-08-01 MED ORDER — BUPROPION HCL ER (XL) 150 MG PO TB24: 150.0000 mg | ORAL_TABLET | ORAL | 1 refills | Status: DC

## 2022-08-01 MED ORDER — VALSARTAN-HYDROCHLOROTHIAZIDE 160-12.5 MG PO TABS: 1.0000 | ORAL_TABLET | Freq: Every day | ORAL | 1 refills | Status: DC

## 2022-08-01 NOTE — Progress Notes (Signed)
I,[name],acting as a Neurosurgeon for [employer], FNP.,have documented all relevant documentation on the behalf of Arnette Felts, FNP,as directed by  Arnette Felts, FNP while in the presence of Arnette Felts, FNP.  Subjective:     Patient ID: Frances Estrada , female    DOB: 1970/12/27 , 52 y.o.   MRN: [id number]   Chief Complaint  Patient presents with   Hypertension    HPI  Patient is here for HTN follow up here to follow up on medication change.  She feels like she has a pulled a muscle to her left scapula. She reports her significant other will have to massage the area deep into her muscle tissue. She did stop the wellbutrin after she was going to the therapist. She has started back about 5 months ago.   She is now taking her metformin better since her last visit.   Wt Readings from Last 3 Encounters: 08/01/22 : 220 lb (99.8 kg) 05/14/22 : 216 lb (98 kg) 01/10/22 : 216 lb 12.8 oz (98.3 kg)    Hypertension This is a chronic problem. The current episode started more than 1 year ago. The problem is unchanged. The problem is uncontrolled. There are no associated agents to hypertension. Risk factors for coronary artery disease include obesity and sedentary lifestyle. Past treatments include diuretics. The current treatment provides no improvement. There are no compliance problems.  There is no history of angina. There is no history of chronic renal disease.     Past Medical History:  Diagnosis Date   Hypertension      History reviewed. No pertinent family history.   Current Outpatient Medications:    cyclobenzaprine (FLEXERIL) 10 MG tablet, Take 1 tablet (10 mg total) by mouth 3 (three) times daily as needed for muscle spasms., Disp: 30 tablet, Rfl: 0   ALPRAZolam (XANAX) 0.25 MG tablet, Take 1 tablet (0.25 mg total) by mouth 2 (two) times daily as needed., Disp: 30 tablet, Rfl: 0   buPROPion (WELLBUTRIN XL) 150 MG 24 hr tablet, Take 1 tablet (150 mg total) by mouth every  morning., Disp: 90 tablet, Rfl: 1   Cholecalciferol (VITAMIN D3 ULTRA STRENGTH) 125 MCG (5000 UT) capsule, Take 1 capsule (5,000 Units total) by mouth daily., Disp: 90 capsule, Rfl: 1   diltiazem (CARDIZEM) 120 MG tablet, TAKE ONE TABLET BY MOUTH ONE TIME DAILY, Disp: 90 tablet, Rfl: 0   Magnesium 250 MG TABS, Take 1 tablet (250 mg total) by mouth daily., Disp: 90 tablet, Rfl: 1   metFORMIN (GLUCOPHAGE XR) 750 MG 24 hr tablet, Take 1 tablet (750 mg total) by mouth daily with breakfast., Disp: 90 tablet, Rfl: 1   valsartan-hydrochlorothiazide (DIOVAN-HCT) 160-12.5 MG tablet, Take 1 tablet by mouth daily., Disp: 90 tablet, Rfl: 1   No Known Allergies   Review of Systems  Constitutional: Negative.   Respiratory: Negative.    Cardiovascular: Negative.   Gastrointestinal: Negative.   Musculoskeletal:  Positive for back pain (muscle tension).  Skin: Negative.   Neurological:  Negative for dizziness.  Psychiatric/Behavioral: Negative.       Today's Vitals   08/01/22 1533  BP: (!) 140/90  Pulse: 88  Temp: 98.4 F (36.9 C)  TempSrc: Oral  Weight: 220 lb (99.8 kg)  Height: 5\' 7"  (1.702 m)   Body mass index is 34.46 kg/m.   Objective:  Physical Exam Vitals reviewed.  Constitutional:      General: She is not in acute distress.    Appearance: Normal appearance.  She is obese.  Cardiovascular:     Rate and Rhythm: Regular rhythm.     Pulses: Normal pulses.     Heart sounds: Normal heart sounds. No murmur heard. Pulmonary:     Effort: Pulmonary effort is normal. No respiratory distress.     Breath sounds: Normal breath sounds. No wheezing.  Skin:    General: Skin is warm and dry.     Capillary Refill: Capillary refill takes less than 2 seconds.  Neurological:     General: No focal deficit present.     Mental Status: She is alert and oriented to person, place, and time.     Cranial Nerves: No cranial nerve deficit.     Motor: No weakness.  Psychiatric:        Attention and  Perception: Attention and perception normal.        Mood and Affect: Mood and affect normal. Mood is not anxious.        Behavior: Behavior normal.        Thought Content: Thought content normal.        Cognition and Memory: Cognition normal.        Judgment: Judgment normal.         Assessment And Plan:     1. Essential hypertension Comments: Her blood pressure is elevated today with the change in medications, may be related to pain to back. - valsartan-hydrochlorothiazide (DIOVAN-HCT) 160-12.5 MG tablet; Take 1 tablet by mouth daily.  Dispense: 90 tablet; Refill: 1  2. Muscle tension pain Comments: Will treat with muscle relaxer. - cyclobenzaprine (FLEXERIL) 10 MG tablet; Take 1 tablet (10 mg total) by mouth 3 (three) times daily as needed for muscle spasms.  Dispense: 30 tablet; Refill: 0  3. Anxiety Comments: she had stopped taking wellbutrin but is now back on medication due to more stress at home with her father living with her again. - buPROPion (WELLBUTRIN XL) 150 MG 24 hr tablet; Take 1 tablet (150 mg total) by mouth every morning.  Dispense: 90 tablet; Refill: 1  4. Other depression Comments: Continue wellbutrin - buPROPion (WELLBUTRIN XL) 150 MG 24 hr tablet; Take 1 tablet (150 mg total) by mouth every morning.  Dispense: 90 tablet; Refill: 1  5. COVID-19 vaccination declined Declines covid 19 vaccine. Discussed risk of covid 50 and if she changes her mind about the vaccine to call the office. Education has been provided regarding the importance of this vaccine but patient still declined. Advised may receive this vaccine at local pharmacy or Health Dept.or vaccine clinic. Aware to provide a copy of the vaccination record if obtained from local pharmacy or Health Dept.  Encouraged to take multivitamin, vitamin d, vitamin c and zinc to increase immune system. Aware can call office if would like to have vaccine here at office. Verbalized acceptance and understanding.  6.  Influenza vaccination declined Patient declined influenza vaccination at this time. Patient is aware that influenza vaccine prevents illness in 70% of healthy people, and reduces hospitalizations to 30-70% in elderly. This vaccine is recommended annually. Education has been provided regarding the importance of this vaccine but patient still declined. Advised may receive this vaccine at local pharmacy or Health Dept.or vaccine clinic. Aware to provide a copy of the vaccination record if obtained from local pharmacy or Health Dept.  Pt is willing to accept risk associated with refusing vaccination.    Patient was given opportunity to ask questions. Patient verbalized understanding of the plan and was able to  repeat key elements of the plan. All questions were answered to their satisfaction.  [name], FNP   I, [name], FNP, have reviewed all documentation for this visit. The documentation on 08/01/22 for the exam, diagnosis, procedures, and orders are all accurate and complete.   IF YOU HAVE BEEN REFERRED TO A SPECIALIST, IT MAY TAKE 1-2 WEEKS TO SCHEDULE/PROCESS THE REFERRAL. IF YOU HAVE NOT HEARD FROM US/SPECIALIST IN TWO WEEKS, PLEASE GIVE [place] A CALL AT [phone] X [phone].   THE PATIENT IS ENCOURAGED TO PRACTICE SOCIAL DISTANCING DUE TO THE COVID-19 PANDEMIC.

## 2022-08-01 NOTE — Patient Instructions (Addendum)
Hypertension, Adult High blood pressure (hypertension) is when the force of blood pumping through the arteries is too strong. The arteries are the blood vessels that carry blood from the heart throughout the body. Hypertension forces the heart to work harder to pump blood and may cause arteries to become narrow or stiff. Untreated or uncontrolled hypertension can lead to a heart attack, heart failure, a stroke, kidney disease, and other problems. A blood pressure reading consists of a higher number over a lower number. Ideally, your blood pressure should be below 120/80. The first ("top") number is called the systolic pressure. It is a measure of the pressure in your arteries as your heart beats. The second ("bottom") number is called the diastolic pressure. It is a measure of the pressure in your arteries as the heart relaxes. What are the causes? The exact cause of this condition is not known. There are some conditions that result in high blood pressure. What increases the risk? Certain factors may make you more likely to develop high blood pressure. Some of these risk factors are under your control, including: Smoking. Not getting enough exercise or physical activity. Being overweight. Having too much fat, sugar, calories, or salt (sodium) in your diet. Drinking too much alcohol. Other risk factors include: Having a personal history of heart disease, diabetes, high cholesterol, or kidney disease. Stress. Having a family history of high blood pressure and high cholesterol. Having obstructive sleep apnea. Age. The risk increases with age. What are the signs or symptoms? High blood pressure may not cause symptoms. Very high blood pressure (hypertensive crisis) may cause: Headache. Fast or irregular heartbeats (palpitations). Shortness of breath. Nosebleed. Nausea and vomiting. Vision changes. Severe chest pain, dizziness, and seizures. How is this diagnosed? This condition is diagnosed by  measuring your blood pressure while you are seated, with your arm resting on a flat surface, your legs uncrossed, and your feet flat on the floor. The cuff of the blood pressure monitor will be placed directly against the skin of your upper arm at the level of your heart. Blood pressure should be measured at least twice using the same arm. Certain conditions can cause a difference in blood pressure between your right and left arms. If you have a high blood pressure reading during one visit or you have normal blood pressure with other risk factors, you may be asked to: Return on a different day to have your blood pressure checked again. Monitor your blood pressure at home for 1 week or longer. If you are diagnosed with hypertension, you may have other blood or imaging tests to help your health care provider understand your overall risk for other conditions. How is this treated? This condition is treated by making healthy lifestyle changes, such as eating healthy foods, exercising more, and reducing your alcohol intake. You may be referred for counseling on a healthy diet and physical activity. Your health care provider may prescribe medicine if lifestyle changes are not enough to get your blood pressure under control and if: Your systolic blood pressure is above 130. Your diastolic blood pressure is above 80. Your personal target blood pressure may vary depending on your medical conditions, your age, and other factors. Follow these instructions at home: Eating and drinking  Eat a diet that is high in fiber and potassium, and low in sodium, added sugar, and fat. An example of this eating plan is called the DASH diet. DASH stands for Dietary Approaches to Stop Hypertension. To eat this way: Eat   plenty of fresh fruits and vegetables. Try to fill one half of your plate at each meal with fruits and vegetables. Eat whole grains, such as whole-wheat pasta, brown rice, or whole-grain bread. Fill about one  fourth of your plate with whole grains. Eat or drink low-fat dairy products, such as skim milk or low-fat yogurt. Avoid fatty cuts of meat, processed or cured meats, and poultry with skin. Fill about one fourth of your plate with lean proteins, such as fish, chicken without skin, beans, eggs, or tofu. Avoid pre-made and processed foods. These tend to be higher in sodium, added sugar, and fat. Reduce your daily sodium intake. Many people with hypertension should eat less than 1,500 mg of sodium a day. Do not drink alcohol if: Your health care provider tells you not to drink. You are pregnant, may be pregnant, or are planning to become pregnant. If you drink alcohol: Limit how much you have to: 0-1 drink a day for women. 0-2 drinks a day for men. Know how much alcohol is in your drink. In the [place] one drink equals one 12 oz bottle of beer (355 mL), one 5 oz glass of wine (148 mL), or one 1 oz glass of hard liquor (44 mL). Lifestyle  Work with your health care provider to maintain a healthy body weight or to lose weight. Ask what an ideal weight is for you. Get at least 30 minutes of exercise that causes your heart to beat faster (aerobic exercise) most days of the week. Activities may include walking, swimming, or biking. Include exercise to strengthen your muscles (resistance exercise), such as Pilates or lifting weights, as part of your weekly exercise routine. Try to do these types of exercises for 30 minutes at least 3 days a week. Do not use any products that contain nicotine or tobacco. These products include cigarettes, chewing tobacco, and vaping devices, such as e-cigarettes. If you need help quitting, ask your health care provider. Monitor your blood pressure at home as told by your health care provider. Keep all follow-up visits. This is important. Medicines Take over-the-counter and prescription medicines only as told by your health care provider. Follow directions carefully. Blood  pressure medicines must be taken as prescribed. Do not skip doses of blood pressure medicine. Doing this puts you at risk for problems and can make the medicine less effective. Ask your health care provider about side effects or reactions to medicines that you should watch for. Contact a health care provider if you: Think you are having a reaction to a medicine you are taking. Have headaches that keep coming back (recurring). Feel dizzy. Have swelling in your ankles. Have trouble with your vision. Get help right away if you: Develop a severe headache or confusion. Have unusual weakness or numbness. Feel faint. Have severe pain in your chest or abdomen. Vomit repeatedly. Have trouble breathing. These symptoms may be an emergency. Get help right away. Call 911. Do not wait to see if the symptoms will go away. Do not drive yourself to the hospital. Summary Hypertension is when the force of blood pumping through your arteries is too strong. If this condition is not controlled, it may put you at risk for serious complications. Your personal target blood pressure may vary depending on your medical conditions, your age, and other factors. For most people, a normal blood pressure is less than 120/80. Hypertension is treated with lifestyle changes, medicines, or a combination of both. Lifestyle changes include losing weight, eating a healthy,   low-sodium diet, exercising more, and limiting alcohol. This information is not intended to replace advice given to you by your health care provider. Make sure you discuss any questions you have with your health care provider. Document Revised: 05/08/2021 Document Reviewed: 05/08/2021 Elsevier Patient Education  [place].   Irritable Bowel Syndrome and Diet: The Foods You Can Eat Individuals with irritable bowel syndrome (IBS) can use nutrition and lifestyle strategies to help control and manage gut symptoms, improve quality of life, and optimize  digestive health. It is common for people with IBS to experience gut symptoms after eating certain kinds of foods, and what may trigger symptoms in one person may not trigger symptoms in someone else. There are general strategies that can help everyone with IBS, and yet what works best for you will require an individualized approach.  Food is a powerful tool to have in your toolbox, and a registered dietitian can help guide and support you in creating a long-term strategy and plan that works for you and your lifestyle. This could include helping to foster a positive relationship with food, increasing confidence when making food choices at home and when out, encouraging nourishing foods that won't worsen gut symptoms, preventing unnecessary food restrictions, and managing potential food fears.  15 Nutrition and Lifestyle Strategies for IBS Enjoy meals at regular times, chew well, and eat slowly. You may find it easier to digest and tolerate smaller portions of food vs. larger portions.  Drink at least 8 cups (2 L) of fluid per day (e.g., water, herbal tea, broth) to stay hydrated. Try a short-term low FODMAP diet to help identify specific food triggers. FODMAPs are a group of specific carbohydrates that might trigger gut symptoms. High FODMAP foods include apples, onion, garlic, wheat, lactose, and sugar alcohols.  Space fruit intake apart by 2-3 hours and stick to no more than one fruit portion per meal or snack. Choose cooked vegetables more often than raw, as cooked vegetables are easier to digest. Choose easier-to-digest proteins, such as eggs, chicken, [place], fish, extra-firm tofu, and plain lactose-free greek yogurt. Lower-fat cooking methods, such as baking, roasting, steaming, boiling, and sauting, can also help you avoid uncomfortable symptoms.  Consider adding in certain types of fibre if you are constipated, such as flaxseeds, oats, inulin, or psyllium. Avoid wheat bran and prunes, which  are highly fermentable fibres that can trigger symptoms such as gas and abdominal pain.  Limit gas-producing vegetables and legumes, such as broccoli, cauliflower, cabbage, brussels sprouts, chickpeas, lentils, and black beans, if they trigger symptoms for you.  Limit coffee and strong caffeinated teas (black, green) to no more than 3 cups per day. Limit alcohol, carbonated drinks, spicy foods, and deep fried, greasy foods (e.g., French fries, pizza, hamburgers, tempura).  Limit sugar alcohols and artificial sweeteners, such as sorbitol, mannitol, xylitol, maltitol, and erythritol, especially if you are experiencing diarrhea. Some foods naturally contain these, such as prunes, cauliflower, and mushrooms, (except oyster mushrooms) as well as sugar-free candies and gums. Consider a short-term trial of a daily probiotic for at least one month and monitor symptoms. Check out probioticchart.ca for a list of evidence-based probiotics in [place]. Ask your doctor, pharmacist, and/or registered dietitian if there are any reasons why you shouldn't be taking a probiotic (e.g., immunocompromised).  Rule out gluten intolerance and celiac disease. It is possible for people to experience an intolerance to the carbohydrates in wheat (FODMAP) instead of the protein in wheat (gluten), which may be one reason why  many people with suspected gluten intolerance tolerate 100% sourdough wheat bread (low FODMAP), but not regular wheat bread.  Enjoy regular physical activity. This can help to reduce gas, bloating, stress, and anxiety, all of which can trigger gut symptoms. Talk to your doctor, kinesiologist, and/or physiotherapist about which level of physical activity is right for you.  Manage stress and anxiety: The brain-gut connection is very strong and well researched. You may notice worsened gut symptoms during times of increased stress and anxiety, which is a common response. Strategies to reduce stress could include  walking in nature, listening to calm music, taking a nap, cooking, meditation, tai chi, yoga, writing, reading, massages, therapy, or anything else that you find helps you to relax. Some people may also want to seek out counselling from a professional and explore psychological therapies such as biofeedback, cognitive behavioral therapy (CBT), and gut-directed hypnotherapy.

## 2022-09-09 ENCOUNTER — Encounter: Payer: BC Managed Care – PPO | Admitting: Nurse Practitioner

## 2022-09-23 ENCOUNTER — Other Ambulatory Visit: Payer: Self-pay | Admitting: Nurse Practitioner

## 2022-09-23 DIAGNOSIS — I10 Essential (primary) hypertension: Secondary | ICD-10-CM

## 2022-10-15 ENCOUNTER — Encounter: Payer: Self-pay | Admitting: Nurse Practitioner

## 2022-10-15 ENCOUNTER — Ambulatory Visit (INDEPENDENT_AMBULATORY_CARE_PROVIDER_SITE_OTHER): Payer: BC Managed Care – PPO | Admitting: Nurse Practitioner

## 2022-10-15 VITALS — BP 124/86 | HR 85 | Temp 98.8°F | Ht 67.0 in | Wt 212.8 lb

## 2022-10-15 DIAGNOSIS — Z0001 Encounter for general adult medical examination with abnormal findings: Secondary | ICD-10-CM

## 2022-10-15 DIAGNOSIS — Z13220 Encounter for screening for lipoid disorders: Secondary | ICD-10-CM | POA: Diagnosis not present

## 2022-10-15 DIAGNOSIS — I10 Essential (primary) hypertension: Secondary | ICD-10-CM | POA: Diagnosis not present

## 2022-10-15 DIAGNOSIS — R7303 Prediabetes: Secondary | ICD-10-CM

## 2022-10-15 DIAGNOSIS — K5909 Other constipation: Secondary | ICD-10-CM

## 2022-10-15 DIAGNOSIS — Z0000 Encounter for general adult medical examination without abnormal findings: Secondary | ICD-10-CM

## 2022-10-15 DIAGNOSIS — Z6833 Body mass index (BMI) 33.0-33.9, adult: Secondary | ICD-10-CM

## 2022-10-15 DIAGNOSIS — G44219 Episodic tension-type headache, not intractable: Secondary | ICD-10-CM

## 2022-10-15 DIAGNOSIS — Z136 Encounter for screening for cardiovascular disorders: Secondary | ICD-10-CM | POA: Diagnosis not present

## 2022-10-15 DIAGNOSIS — E559 Vitamin D deficiency, unspecified: Secondary | ICD-10-CM | POA: Diagnosis not present

## 2022-10-15 DIAGNOSIS — F419 Anxiety disorder, unspecified: Secondary | ICD-10-CM

## 2022-10-15 DIAGNOSIS — E6609 Other obesity due to excess calories: Secondary | ICD-10-CM

## 2022-10-15 MED ORDER — HYDROXYZINE HCL 25 MG PO TABS
25.0000 mg | ORAL_TABLET | Freq: Three times a day (TID) | ORAL | 0 refills | Status: AC | PRN
Start: 2022-10-15 — End: ?
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
## 2022-10-16 ENCOUNTER — Other Ambulatory Visit: Payer: Self-pay | Admitting: Nurse Practitioner

## 2022-10-16 ENCOUNTER — Other Ambulatory Visit: Payer: Self-pay

## 2022-10-16 DIAGNOSIS — R7303 Prediabetes: Secondary | ICD-10-CM

## 2022-10-16 LAB — CMP14+EGFR
ALT: 9 IU/L (ref 0–32)
AST: 16 IU/L (ref 0–40)
Albumin/Globulin Ratio: 1.3 (ref 1.2–2.2)
Albumin: 4.8 g/dL (ref 3.8–4.9)
Alkaline Phosphatase: 81 IU/L (ref 44–121)
BUN/Creatinine Ratio: 13 (ref 9–23)
BUN: 10 mg/dL (ref 6–24)
Bilirubin Total: 0.7 mg/dL (ref 0.0–1.2)
CO2: 21 mmol/L (ref 20–29)
Calcium: 9.6 mg/dL (ref 8.7–10.2)
Chloride: 100 mmol/L (ref 96–106)
Creatinine, Ser: 0.8 mg/dL (ref 0.57–1.00)
Globulin, Total: 3.6 g/dL (ref 1.5–4.5)
Glucose: 103 mg/dL — ABNORMAL HIGH (ref 70–99)
Potassium: 3.6 mmol/L (ref 3.5–5.2)
Sodium: 139 mmol/L (ref 134–144)
Total Protein: 8.4 g/dL (ref 6.0–8.5)
eGFR: 89 mL/min/{1.73_m2} (ref >59)

## 2022-10-16 LAB — LIPID PANEL
Chol/HDL Ratio: 3.3 ratio (ref 0.0–4.4)
Cholesterol, Total: 180 mg/dL (ref 100–199)
HDL: 55 mg/dL (ref >39)
LDL Chol Calc (NIH): 112 mg/dL — ABNORMAL HIGH (ref 0–99)
Triglycerides: 71 mg/dL (ref 0–149)
VLDL Cholesterol Cal: 13 mg/dL (ref 5–40)

## 2022-10-16 LAB — HEMOGLOBIN A1C
Est. average glucose Bld gHb Est-mCnc: 128 mg/dL
Hgb A1c MFr Bld: 6.1 % — ABNORMAL HIGH (ref 4.8–5.6)

## 2022-10-16 LAB — VITAMIN D 25 HYDROXY (VIT D DEFICIENCY, FRACTURES): Vit D, 25-Hydroxy: 31.5 ng/mL (ref 30.0–100.0)

## 2022-10-16 LAB — CBC
Hematocrit: 38.1 % (ref 34.0–46.6)
Hemoglobin: 13.1 g/dL (ref 11.1–15.9)
MCH: 30.2 pg (ref 26.6–33.0)
MCHC: 34.4 g/dL (ref 31.5–35.7)
MCV: 88 fL (ref 79–97)
Platelets: 259 10*3/uL (ref 150–450)
RBC: 4.34 x10E6/uL (ref 3.77–5.28)
RDW: 13.1 % (ref 11.7–15.4)
WBC: 4.1 10*3/uL (ref 3.4–10.8)

## 2022-10-16 MED ORDER — METFORMIN HCL ER (MOD) 1000 MG PO TB24: 1000.0000 mg | ORAL_TABLET | Freq: Every day | ORAL | 1 refills | Status: DC

## 2022-10-16 MED ORDER — METFORMIN HCL ER (MOD) 1000 MG PO TB24
1000.0000 mg | ORAL_TABLET | Freq: Every day | ORAL | 3 refills | Status: DC
Start: 2022-10-16 — End: 2022-10-21

## 2022-10-21 ENCOUNTER — Other Ambulatory Visit: Payer: Self-pay

## 2022-10-21 MED ORDER — METFORMIN HCL ER (OSM) 1000 MG PO TB24: 1000.0000 mg | ORAL_TABLET | Freq: Every day | ORAL | 3 refills | Status: DC

## 2022-10-23 ENCOUNTER — Telehealth: Payer: Self-pay

## 2022-10-23 NOTE — Telephone Encounter (Signed)
Received FMLA forms for completion.  Paperwork has been completed and faxed to employer as indicated on form. Patient will come pick up her coy from the front desk.

## 2022-10-28 ENCOUNTER — Telehealth: Payer: Self-pay

## 2022-10-28 ENCOUNTER — Other Ambulatory Visit: Payer: Self-pay | Admitting: Nurse Practitioner

## 2022-10-28 DIAGNOSIS — R7303 Prediabetes: Secondary | ICD-10-CM

## 2022-10-28 MED ORDER — METFORMIN HCL ER 750 MG PO TB24
750.0000 mg | ORAL_TABLET | Freq: Every day | ORAL | 1 refills | Status: DC
Start: 2022-10-28 — End: 2023-10-21

## 2022-10-28 NOTE — Telephone Encounter (Signed)
Per pharmacy Metformin HCI ER requires PA.  PA started via [facility] [place]: [employer]

## 2022-10-28 NOTE — Telephone Encounter (Signed)
PA has been DENIED  Outcome Denied on April 12 Document: EPA_ETC_V 1 Decision Notes: This drug is not on our list of covered drugs, also known as a formulary. Our Coverage Determinations - Exceptions policy is used to decide if a not-covered drug can be approved. The conditions in this policy have not been met. From the records that we have received, these reasons caused the denial: 1) All covered drugs used for your health issue have not been tried and failed. Other drugs that can be used are metformin immediate release (IR), metformin extended release (ER) (Glucophage XR equivalent), sulfonylureas (glimepiride, glipizide, glyburide), DPP-4 inhibitors (Januvia, Tradjenta), GLP-1 agonists (Victoza, Bydureon BCise), meglitinides (nateglinide, repaglinide), thiazolidinediones (pioglitazone), SGLT-2 inhibitors (Farxiga, Jardiance), and alpha-glucosidase inhibitors (acarbose). Please look at the formulary to see what drugs are covered. Prior authorization may be required and quantity limits may apply to covered drugs. ADDITIONAL INFORMATION FOR YOUR HEALTH CARE PROVIDER: This request has not been approved because this drug is not on formulary. An exception to allow coverage of a non-formulary drug may be granted if all conditions in our Coverage Determinations - Exceptions policy are met. From the information we have received, the member does not meet number 2 of the exception policy criteria. The reason for denial is explained to the member above. The criteria from the policy are listed here. 1) The drug is being used for a condition approved by the [place] (FDA). 2) All formulary alternatives have been tried or medical reasons have been provided why all other covered drugs cannot be tried. 3) Records have been received showing the requested drug is medically necessary. These should include relevant medical history and lab results, past treatments tried with dates of trial and  responses, and any other evidence to show the covered drugs are likely to be ineffective.Marland KitchenMarland Kitchen

## 2022-11-06 ENCOUNTER — Other Ambulatory Visit: Payer: Self-pay | Admitting: Nurse Practitioner

## 2023-01-14 ENCOUNTER — Other Ambulatory Visit: Payer: Self-pay | Admitting: Nurse Practitioner

## 2023-01-14 DIAGNOSIS — I10 Essential (primary) hypertension: Secondary | ICD-10-CM

## 2023-02-17 ENCOUNTER — Ambulatory Visit: Payer: BC Managed Care – PPO | Admitting: Nurse Practitioner

## 2023-02-17 NOTE — Progress Notes (Deleted)
Madelaine Bhat, CMA,acting as a Neurosurgeon for Arnette Felts, FNP.,have documented all relevant documentation on the behalf of Arnette Felts, FNP,as directed by  Arnette Felts, FNP while in the presence of Arnette Felts, FNP.  Subjective:  Patient ID: Frances Estrada , female    DOB: September 17, 1970 , 52 y.o.   MRN: [id number]  No chief complaint on file.   HPI  Patient presents today for a BP, and Pre DM follow up. Patient reports compliance with medications. Patient denies any headaches, SOB, or chest pain. Patient has no other concerns today.     Past Medical History:  Diagnosis Date  . Hypertension      No family history on file.   Current Outpatient Medications:  .  ALPRAZolam (XANAX) 0.25 MG tablet, Take 1 tablet (0.25 mg total) by mouth 2 (two) times daily as needed., Disp: 30 tablet, Rfl: 0 .  buPROPion (WELLBUTRIN XL) 150 MG 24 hr tablet, Take 1 tablet (150 mg total) by mouth every morning., Disp: 90 tablet, Rfl: 1 .  Cholecalciferol (VITAMIN D3 ULTRA STRENGTH) 125 MCG (5000 UT) capsule, Take 1 capsule (5,000 Units total) by mouth daily., Disp: 90 capsule, Rfl: 1 .  cyclobenzaprine (FLEXERIL) 10 MG tablet, Take 1 tablet (10 mg total) by mouth 3 (three) times daily as needed for muscle spasms., Disp: 30 tablet, Rfl: 0 .  diltiazem (CARDIZEM) 120 MG tablet, TAKE ONE TABLET BY MOUTH ONE TIME DAILY, Disp: 90 tablet, Rfl: 0 .  hydrOXYzine (ATARAX) 25 MG tablet, Take 1 tablet (25 mg total) by mouth 3 (three) times daily as needed., Disp: 30 tablet, Rfl: 0 .  Magnesium 250 MG TABS, Take 1 tablet (250 mg total) by mouth daily., Disp: 90 tablet, Rfl: 1 .  metFORMIN (GLUCOPHAGE-XR) 750 MG 24 hr tablet, Take 1 tablet (750 mg total) by mouth daily with breakfast., Disp: 90 tablet, Rfl: 1 .  valsartan-hydrochlorothiazide (DIOVAN-HCT) 160-12.5 MG tablet, Take 1 tablet by mouth daily., Disp: 90 tablet, Rfl: 1   No Known Allergies   Review of Systems   There were no vitals filed for this  visit. There is no height or weight on file to calculate BMI.  Wt Readings from Last 3 Encounters:  10/15/22 212 lb 12.8 oz (96.5 kg)  08/01/22 220 lb (99.8 kg)  05/14/22 216 lb (98 kg)    The 10-year ASCVD risk score (Arnett DK, et al., 2019) is: 1.5%   Values used to calculate the score:     Age: 68 years     Sex: Female     Is Non-Hispanic African American: No     Diabetic: No     Tobacco smoker: No     Systolic Blood Pressure: 124 mmHg     Is BP treated: Yes     HDL Cholesterol: 55 mg/dL     Total Cholesterol: 180 mg/dL  Objective:  Physical Exam      Assessment And Plan:  Encounter for annual health examination  Essential hypertension  Prediabetes    No follow-ups on file.  Patient was given opportunity to ask questions. Patient verbalized understanding of the plan and was able to repeat key elements of the plan. All questions were answered to their satisfaction.    Jeanell Sparrow, FNP, have reviewed all documentation for this visit. The documentation on 02/17/23 for the exam, diagnosis, procedures, and orders are all accurate and complete.   IF YOU HAVE BEEN REFERRED TO A SPECIALIST, IT MAY TAKE 1-2 WEEKS TO  SCHEDULE/PROCESS THE REFERRAL. IF YOU HAVE NOT HEARD FROM US/SPECIALIST IN TWO WEEKS, PLEASE GIVE [place] A CALL AT [phone].

## 2023-03-27 ENCOUNTER — Ambulatory Visit: Payer: BC Managed Care – PPO | Admitting: Nurse Practitioner

## 2023-03-27 ENCOUNTER — Encounter: Payer: Self-pay | Admitting: Nurse Practitioner

## 2023-03-27 VITALS — BP 120/80 | HR 84 | Temp 98.7°F | Ht 67.0 in | Wt 216.2 lb

## 2023-03-27 DIAGNOSIS — I10 Essential (primary) hypertension: Secondary | ICD-10-CM | POA: Diagnosis not present

## 2023-03-27 DIAGNOSIS — R7303 Prediabetes: Secondary | ICD-10-CM | POA: Diagnosis not present

## 2023-03-27 DIAGNOSIS — L299 Pruritus, unspecified: Secondary | ICD-10-CM

## 2023-03-27 DIAGNOSIS — F419 Anxiety disorder, unspecified: Secondary | ICD-10-CM

## 2023-03-27 DIAGNOSIS — F3289 Other specified depressive episodes: Secondary | ICD-10-CM | POA: Diagnosis not present

## 2023-03-27 DIAGNOSIS — Z6833 Body mass index (BMI) 33.0-33.9, adult: Secondary | ICD-10-CM

## 2023-03-27 DIAGNOSIS — Z1231 Encounter for screening mammogram for malignant neoplasm of breast: Secondary | ICD-10-CM

## 2023-03-27 DIAGNOSIS — Z2821 Immunization not carried out because of patient refusal: Secondary | ICD-10-CM

## 2023-03-27 DIAGNOSIS — E6609 Other obesity due to excess calories: Secondary | ICD-10-CM

## 2023-03-27 MED ORDER — TRIAMCINOLONE ACETONIDE 0.5 % EX OINT
1.0000 | TOPICAL_OINTMENT | Freq: Two times a day (BID) | CUTANEOUS | 3 refills | Status: AC
Start: 2023-03-27 — End: ?

## 2023-03-27 MED ORDER — VALSARTAN-HYDROCHLOROTHIAZIDE 160-12.5 MG PO TABS: 1.0000 | ORAL_TABLET | Freq: Every day | ORAL | 1 refills | Status: DC

## 2023-03-27 NOTE — Patient Instructions (Addendum)
Take over there counter claritin or allegra daily to help with itching.  Also make sure you are moisturizing your back with Eucerin cream or Cetaphil or similar moisturizer.

## 2023-03-27 NOTE — Progress Notes (Signed)
Madelaine Bhat, CMA,acting as a Neurosurgeon for Arnette Felts, FNP.,have documented all relevant documentation on the behalf of Arnette Felts, FNP,as directed by  Arnette Felts, FNP while in the presence of Arnette Felts, FNP.  Subjective:  Patient ID: Frances Estrada , female    DOB: 31-Oct-1970 , 52 y.o.   MRN: [id number]  Chief Complaint  Patient presents with   Hypertension    HPI  Patient presents today for bp and pre DM follow up, Patient reports compliance with medications. Patient denies any Chest pain, SOB, and headaches. Patient reports she has been itching on her back and her stomach as well she will put cortisone on the areas that is itching, she reports she hasn't used anything new. She is using dove soap (Pure).   Patients back is red, all over.   Patient would like a referral to get her mammogram.   BP Readings from Last 3 Encounters: 03/27/23 : 120/80 10/15/22 : 124/86 08/01/22 : (!) 140/90    Hypertension This is a chronic problem. The current episode started more than 1 year ago. The problem is unchanged. The problem is uncontrolled. Associated symptoms include anxiety. Pertinent negatives include no blurred vision. There are no associated agents to hypertension. Risk factors for coronary artery disease include obesity and sedentary lifestyle. Past treatments include diuretics. The current treatment provides no improvement. There are no compliance problems.  There is no history of angina. There is no history of chronic renal disease.  Diabetes She presents for her follow-up diabetic visit. Diabetes type: prediabetes. Her disease course has been stable. Pertinent negatives for diabetes include no blurred vision, no fatigue, no polydipsia, no polyphagia and no polyuria. There are no hypoglycemic complications. Symptoms are stable. There are no diabetic complications. Risk factors for coronary artery disease include sedentary lifestyle and obesity. She is compliant with treatment all  of the time. Diabetic current diet: low carb mostly. When asked about meal planning, she reported none. She has not had a previous visit with a dietitian. She rarely participates in exercise. An ACE inhibitor/angiotensin II receptor blocker is being taken. She does not see a podiatrist.Eye exam is current.     Past Medical History:  Diagnosis Date   Hypertension      History reviewed. No pertinent family history.   Current Outpatient Medications:    ALPRAZolam (XANAX) 0.25 MG tablet, Take 1 tablet (0.25 mg total) by mouth 2 (two) times daily as needed., Disp: 30 tablet, Rfl: 0   buPROPion (WELLBUTRIN XL) 150 MG 24 hr tablet, Take 1 tablet (150 mg total) by mouth every morning., Disp: 90 tablet, Rfl: 1   Cholecalciferol (VITAMIN D3 ULTRA STRENGTH) 125 MCG (5000 UT) capsule, Take 1 capsule (5,000 Units total) by mouth daily., Disp: 90 capsule, Rfl: 1   cyclobenzaprine (FLEXERIL) 10 MG tablet, Take 1 tablet (10 mg total) by mouth 3 (three) times daily as needed for muscle spasms., Disp: 30 tablet, Rfl: 0   diltiazem (CARDIZEM) 120 MG tablet, TAKE ONE TABLET BY MOUTH ONE TIME DAILY, Disp: 90 tablet, Rfl: 0   hydrOXYzine (ATARAX) 25 MG tablet, Take 1 tablet (25 mg total) by mouth 3 (three) times daily as needed., Disp: 30 tablet, Rfl: 0   Magnesium 250 MG TABS, Take 1 tablet (250 mg total) by mouth daily., Disp: 90 tablet, Rfl: 1   metFORMIN (GLUCOPHAGE-XR) 750 MG 24 hr tablet, Take 1 tablet (750 mg total) by mouth daily with breakfast., Disp: 90 tablet, Rfl: 1   triamcinolone  ointment (KENALOG) 0.5 %, Apply 1 Application topically 2 (two) times daily., Disp: 60 g, Rfl: 3   valsartan-hydrochlorothiazide (DIOVAN-HCT) 160-12.5 MG tablet, Take 1 tablet by mouth daily., Disp: 90 tablet, Rfl: 1   No Known Allergies   Review of Systems  Constitutional: Negative.  Negative for fatigue.  HENT: Negative.    Eyes: Negative.  Negative for blurred vision.  Respiratory: Negative.    Cardiovascular:  Negative.   Gastrointestinal: Negative.   Endocrine: Negative for polydipsia, polyphagia and polyuria.  Skin:        Red rash to back.  Psychiatric/Behavioral: Negative.       Today's Vitals   03/27/23 1218  BP: 120/80  Pulse: 84  Temp: 98.7 F (37.1 C)  TempSrc: Oral  Weight: 216 lb 3.2 oz (98.1 kg)  Height: 5\' 7"  (1.702 m)  PainSc: 0-No pain   Body mass index is 33.86 kg/m.  Wt Readings from Last 3 Encounters:  03/27/23 216 lb 3.2 oz (98.1 kg)  10/15/22 212 lb 12.8 oz (96.5 kg)  08/01/22 220 lb (99.8 kg)    The 10-year ASCVD risk score (Arnett DK, et al., 2019) is: 1.4%   Values used to calculate the score:     Age: 17 years     Sex: Female     Is Non-Hispanic African American: No     Diabetic: No     Tobacco smoker: No     Systolic Blood Pressure: 120 mmHg     Is BP treated: Yes     HDL Cholesterol: 55 mg/dL     Total Cholesterol: 180 mg/dL  Objective:  Physical Exam Vitals reviewed.  Constitutional:      General: She is not in acute distress.    Appearance: Normal appearance. She is obese.  Cardiovascular:     Rate and Rhythm: Normal rate and regular rhythm.     Pulses: Normal pulses.     Heart sounds: Normal heart sounds. No murmur heard. Pulmonary:     Effort: Pulmonary effort is normal. No respiratory distress.     Breath sounds: Normal breath sounds. No wheezing.  Skin:    General: Skin is warm and dry.     Capillary Refill: Capillary refill takes less than 2 seconds.     Findings: No rash (no obvious rash).  Neurological:     General: No focal deficit present.     Mental Status: She is alert and oriented to person, place, and time.     Cranial Nerves: No cranial nerve deficit.     Motor: No weakness.  Psychiatric:        Attention and Perception: Attention and perception normal.        Mood and Affect: Mood and affect normal. Mood is not anxious.        Behavior: Behavior normal.        Thought Content: Thought content normal.        Cognition  and Memory: Cognition normal.        Judgment: Judgment normal.         Assessment And Plan:  Essential hypertension Assessment & Plan: Blood pressure is controlled, continue current medications  Orders: -     Valsartan-hydroCHLOROthiazide; Take 1 tablet by mouth daily.  Dispense: 90 tablet; Refill: 1 -     Basic metabolic panel  Prediabetes Assessment & Plan: HgbA1c had increased at last visit, will recheck levels today. Continue with healthy diet and regular exercise  Orders: -  Hemoglobin A1c  Anxiety  Other depression  Pruritus Assessment & Plan: She has been having itching to her back, no obvious rash however will provide rx for steroid cream. I can see some scratch marks. Can also take over the counter zyrtec/claritin. Be sure to moisturize area with moisturizer cream  Orders: -     Triamcinolone Acetonide; Apply 1 Application topically 2 (two) times daily.  Dispense: 60 g; Refill: 3  Influenza vaccination declined  Herpes zoster vaccination declined  Class 1 obesity due to excess calories without serious comorbidity with body mass index (BMI) of 33.0 to 33.9 in adult  Encounter for screening mammogram for malignant neoplasm of breast -     Digital Screening Mammogram, Left and Right; Future  COVID-19 vaccination declined Assessment & Plan: Declines covid 19 vaccine. Discussed risk of covid 77 and if she changes her mind about the vaccine to call the office. Education has been provided regarding the importance of this vaccine but patient still declined. Advised may receive this vaccine at local pharmacy or Health Dept.or vaccine clinic. Aware to provide a copy of the vaccination record if obtained from local pharmacy or Health Dept.  Encouraged to take multivitamin, vitamin d, vitamin c and zinc to increase immune system. Aware can call office if would like to have vaccine here at office. Verbalized acceptance and understanding.    Had hysterectomy 07/12/2015  - see care everywhere  Return for 6 month bp check.  Patient was given opportunity to ask questions. Patient verbalized understanding of the plan and was able to repeat key elements of the plan. All questions were answered to their satisfaction.    Jeanell Sparrow, FNP, have reviewed all documentation for this visit. The documentation on 03/27/23 for the exam, diagnosis, procedures, and orders are all accurate and complete.   IF YOU HAVE BEEN REFERRED TO A SPECIALIST, IT MAY TAKE 1-2 WEEKS TO SCHEDULE/PROCESS THE REFERRAL. IF YOU HAVE NOT HEARD FROM US/SPECIALIST IN TWO WEEKS, PLEASE GIVE [place] A CALL AT [phone].

## 2023-03-28 LAB — BASIC METABOLIC PANEL
BUN/Creatinine Ratio: 11 (ref 9–23)
BUN: 9 mg/dL (ref 6–24)
CO2: 22 mmol/L (ref 20–29)
Calcium: 9.7 mg/dL (ref 8.7–10.2)
Chloride: 101 mmol/L (ref 96–106)
Creatinine, Ser: 0.81 mg/dL (ref 0.57–1.00)
Glucose: 114 mg/dL — ABNORMAL HIGH (ref 70–99)
Potassium: 3.6 mmol/L (ref 3.5–5.2)
Sodium: 139 mmol/L (ref 134–144)
eGFR: 88 mL/min/{1.73_m2} (ref >59)

## 2023-03-28 LAB — HEMOGLOBIN A1C
Est. average glucose Bld gHb Est-mCnc: 134 mg/dL
Hgb A1c MFr Bld: 6.3 % — ABNORMAL HIGH (ref 4.8–5.6)

## 2023-04-09 ENCOUNTER — Encounter: Payer: Self-pay | Admitting: Nurse Practitioner

## 2023-04-09 DIAGNOSIS — L299 Pruritus, unspecified: Secondary | ICD-10-CM | POA: Insufficient documentation

## 2023-04-09 NOTE — Assessment & Plan Note (Signed)
She has been having itching to her back, no obvious rash however will provide rx for steroid cream. I can see some scratch marks. Can also take over the counter zyrtec/claritin. Be sure to moisturize area with moisturizer cream

## 2023-04-09 NOTE — Assessment & Plan Note (Signed)

## 2023-04-09 NOTE — Assessment & Plan Note (Signed)
Blood pressure is controlled, continue current medications

## 2023-04-09 NOTE — Assessment & Plan Note (Signed)
HgbA1c had increased at last visit, will recheck levels today. Continue with healthy diet and regular exercise

## 2023-04-09 NOTE — Progress Notes (Incomplete)
Madelaine Bhat, CMA,acting as a Neurosurgeon for Arnette Felts, FNP.,have documented all relevant documentation on the behalf of Arnette Felts, FNP,as directed by  Arnette Felts, FNP while in the presence of Arnette Felts, FNP.  Subjective:  Patient ID: Frances Estrada , female    DOB: 04-19-1971 , 52 y.o.   MRN: [id number]  Chief Complaint  Patient presents with  . Hypertension    HPI  Patient presents today for bp and pre DM follow up, Patient reports compliance with medications. Patient denies any Chest pain, SOB, and headaches. Patient reports she has been itching on her back and her stomach as well she will put cortisone on the areas that is itching, she reports she hasn't used anything new. She is using dove soap (Pure).   Patients back is red, all over.   Patient would like a referral to get her mammogram.   BP Readings from Last 3 Encounters: 03/27/23 : 120/80 10/15/22 : 124/86 08/01/22 : (!) 140/90    Hypertension This is a chronic problem. The current episode started more than 1 year ago. The problem is unchanged. The problem is uncontrolled. Associated symptoms include anxiety. Pertinent negatives include no blurred vision. There are no associated agents to hypertension. Risk factors for coronary artery disease include obesity and sedentary lifestyle. Past treatments include diuretics. The current treatment provides no improvement. There are no compliance problems.  There is no history of angina. There is no history of chronic renal disease.  Diabetes She presents for her follow-up diabetic visit. Diabetes type: prediabetes. Her disease course has been stable. Pertinent negatives for diabetes include no blurred vision, no fatigue, no polydipsia, no polyphagia and no polyuria. There are no hypoglycemic complications. Symptoms are stable. There are no diabetic complications. Risk factors for coronary artery disease include sedentary lifestyle and obesity. She is compliant with treatment  all of the time. Diabetic current diet: low carb mostly. When asked about meal planning, she reported none. She has not had a previous visit with a dietitian. She rarely participates in exercise. An ACE inhibitor/angiotensin II receptor blocker is being taken. She does not see a podiatrist.Eye exam is current.     Past Medical History:  Diagnosis Date  . Hypertension      History reviewed. No pertinent family history.   Current Outpatient Medications:  .  ALPRAZolam (XANAX) 0.25 MG tablet, Take 1 tablet (0.25 mg total) by mouth 2 (two) times daily as needed., Disp: 30 tablet, Rfl: 0 .  buPROPion (WELLBUTRIN XL) 150 MG 24 hr tablet, Take 1 tablet (150 mg total) by mouth every morning., Disp: 90 tablet, Rfl: 1 .  Cholecalciferol (VITAMIN D3 ULTRA STRENGTH) 125 MCG (5000 UT) capsule, Take 1 capsule (5,000 Units total) by mouth daily., Disp: 90 capsule, Rfl: 1 .  cyclobenzaprine (FLEXERIL) 10 MG tablet, Take 1 tablet (10 mg total) by mouth 3 (three) times daily as needed for muscle spasms., Disp: 30 tablet, Rfl: 0 .  diltiazem (CARDIZEM) 120 MG tablet, TAKE ONE TABLET BY MOUTH ONE TIME DAILY, Disp: 90 tablet, Rfl: 0 .  hydrOXYzine (ATARAX) 25 MG tablet, Take 1 tablet (25 mg total) by mouth 3 (three) times daily as needed., Disp: 30 tablet, Rfl: 0 .  Magnesium 250 MG TABS, Take 1 tablet (250 mg total) by mouth daily., Disp: 90 tablet, Rfl: 1 .  metFORMIN (GLUCOPHAGE-XR) 750 MG 24 hr tablet, Take 1 tablet (750 mg total) by mouth daily with breakfast., Disp: 90 tablet, Rfl: 1 .  triamcinolone  ointment (KENALOG) 0.5 %, Apply 1 Application topically 2 (two) times daily., Disp: 60 g, Rfl: 3 .  valsartan-hydrochlorothiazide (DIOVAN-HCT) 160-12.5 MG tablet, Take 1 tablet by mouth daily., Disp: 90 tablet, Rfl: 1   No Known Allergies   Review of Systems  Constitutional: Negative.  Negative for fatigue.  HENT: Negative.    Eyes: Negative.  Negative for blurred vision.  Respiratory: Negative.     Cardiovascular: Negative.   Gastrointestinal: Negative.   Endocrine: Negative for polydipsia, polyphagia and polyuria.  Skin:        Red rash to back.  Psychiatric/Behavioral: Negative.       Today's Vitals   03/27/23 1218  BP: 120/80  Pulse: 84  Temp: 98.7 F (37.1 C)  TempSrc: Oral  Weight: 216 lb 3.2 oz (98.1 kg)  Height: 5\' 7"  (1.702 m)  PainSc: 0-No pain   Body mass index is 33.86 kg/m.  Wt Readings from Last 3 Encounters:  03/27/23 216 lb 3.2 oz (98.1 kg)  10/15/22 212 lb 12.8 oz (96.5 kg)  08/01/22 220 lb (99.8 kg)    The 10-year ASCVD risk score (Arnett DK, et al., 2019) is: 1.4%   Values used to calculate the score:     Age: 64 years     Sex: Female     Is Non-Hispanic African American: No     Diabetic: No     Tobacco smoker: No     Systolic Blood Pressure: 120 mmHg     Is BP treated: Yes     HDL Cholesterol: 55 mg/dL     Total Cholesterol: 180 mg/dL  Objective:  Physical Exam Vitals reviewed.  Constitutional:      General: She is not in acute distress.    Appearance: Normal appearance. She is obese.  Cardiovascular:     Rate and Rhythm: Normal rate and regular rhythm.     Pulses: Normal pulses.     Heart sounds: Normal heart sounds. No murmur heard. Pulmonary:     Effort: Pulmonary effort is normal. No respiratory distress.     Breath sounds: Normal breath sounds. No wheezing.  Skin:    General: Skin is warm and dry.     Capillary Refill: Capillary refill takes less than 2 seconds.     Findings: No rash (no obvious rash).  Neurological:     General: No focal deficit present.     Mental Status: She is alert and oriented to person, place, and time.     Cranial Nerves: No cranial nerve deficit.     Motor: No weakness.  Psychiatric:        Attention and Perception: Attention and perception normal.        Mood and Affect: Mood and affect normal. Mood is not anxious.        Behavior: Behavior normal.        Thought Content: Thought content normal.         Cognition and Memory: Cognition normal.        Judgment: Judgment normal.         Assessment And Plan:  Essential hypertension -     Valsartan-hydroCHLOROthiazide; Take 1 tablet by mouth daily.  Dispense: 90 tablet; Refill: 1 -     Basic metabolic panel  Prediabetes -     Hemoglobin A1c  Anxiety  Other depression  Pruritus -     Triamcinolone Acetonide; Apply 1 Application topically 2 (two) times daily.  Dispense: 60 g; Refill: 3  Influenza vaccination  declined  Herpes zoster vaccination declined  Class 1 obesity due to excess calories without serious comorbidity with body mass index (BMI) of 33.0 to 33.9 in adult  Encounter for screening mammogram for malignant neoplasm of breast -     Digital Screening Mammogram, Left and Right; Future  COVID-19 vaccination declined  Had hysterectomy 07/12/2015 - see care everywhere  Return for 6 month bp check.  Patient was given opportunity to ask questions. Patient verbalized understanding of the plan and was able to repeat key elements of the plan. All questions were answered to their satisfaction.    Jeanell Sparrow, FNP, have reviewed all documentation for this visit. The documentation on 04/09/23 for the exam, diagnosis, procedures, and orders are all accurate and complete.   IF YOU HAVE BEEN REFERRED TO A SPECIALIST, IT MAY TAKE 1-2 WEEKS TO SCHEDULE/PROCESS THE REFERRAL. IF YOU HAVE NOT HEARD FROM US/SPECIALIST IN TWO WEEKS, PLEASE GIVE [place] A CALL AT [phone].

## 2023-06-03 ENCOUNTER — Other Ambulatory Visit: Payer: Self-pay | Admitting: Nurse Practitioner

## 2023-06-03 DIAGNOSIS — I10 Essential (primary) hypertension: Secondary | ICD-10-CM

## 2023-06-05 ENCOUNTER — Ambulatory Visit
Admission: RE | Admit: 2023-06-05 | Discharge: 2023-06-05 | Disposition: A | Discharge status: Home / Self Care | Payer: BC Managed Care – PPO | Source: Ambulatory Visit | Attending: Nurse Practitioner | Admitting: Nurse Practitioner

## 2023-06-05 DIAGNOSIS — Z1231 Encounter for screening mammogram for malignant neoplasm of breast: Secondary | ICD-10-CM | POA: Diagnosis not present

## 2023-09-24 ENCOUNTER — Ambulatory Visit: Payer: BC Managed Care – PPO | Admitting: Nurse Practitioner

## 2023-10-14 ENCOUNTER — Ambulatory Visit: Admitting: Nurse Practitioner

## 2023-10-21 ENCOUNTER — Encounter: Payer: Self-pay | Admitting: Nurse Practitioner

## 2023-10-21 ENCOUNTER — Ambulatory Visit: Payer: Self-pay | Admitting: Nurse Practitioner

## 2023-10-21 VITALS — BP 130/84 | HR 91 | Temp 98.6°F | Ht 67.0 in | Wt 227.0 lb

## 2023-10-21 DIAGNOSIS — F419 Anxiety disorder, unspecified: Secondary | ICD-10-CM | POA: Insufficient documentation

## 2023-10-21 DIAGNOSIS — E6609 Other obesity due to excess calories: Secondary | ICD-10-CM

## 2023-10-21 DIAGNOSIS — I10 Essential (primary) hypertension: Secondary | ICD-10-CM | POA: Diagnosis not present

## 2023-10-21 DIAGNOSIS — F4321 Adjustment disorder with depressed mood: Secondary | ICD-10-CM

## 2023-10-21 DIAGNOSIS — R7303 Prediabetes: Secondary | ICD-10-CM

## 2023-10-21 DIAGNOSIS — Z6835 Body mass index (BMI) 35.0-35.9, adult: Secondary | ICD-10-CM

## 2023-10-21 DIAGNOSIS — E66812 Obesity, class 2: Secondary | ICD-10-CM

## 2023-10-21 MED ORDER — BUPROPION HCL ER (XL) 150 MG PO TB24: 150.0000 mg | ORAL_TABLET | ORAL | 1 refills | Status: AC

## 2023-10-21 MED ORDER — METFORMIN HCL ER 750 MG PO TB24: 750.0000 mg | ORAL_TABLET | Freq: Every day | ORAL | 1 refills | Status: DC

## 2023-10-21 MED ORDER — DILTIAZEM HCL 120 MG PO TABS: 120.0000 mg | ORAL_TABLET | Freq: Every day | ORAL | 0 refills | Status: DC

## 2023-10-21 NOTE — Patient Instructions (Signed)
 Hypertension, Adult Hypertension is another name for high blood pressure. High blood pressure forces your heart to work harder to pump blood. This can cause problems over time. There are two numbers in a blood pressure reading. There is a top number (systolic) over a bottom number (diastolic). It is best to have a blood pressure that is below 120/80. What are the causes? The cause of this condition is not known. Some other conditions can lead to high blood pressure. What increases the risk? Some lifestyle factors can make you more likely to develop high blood pressure: Smoking. Not getting enough exercise or physical activity. Being overweight. Having too much fat, sugar, calories, or salt (sodium) in your diet. Drinking too much alcohol. Other risk factors include: Having any of these conditions: Heart disease. Diabetes. High cholesterol. Kidney disease. Obstructive sleep apnea. Having a family history of high blood pressure and high cholesterol. Age. The risk increases with age. Stress. What are the signs or symptoms? High blood pressure may not cause symptoms. Very high blood pressure (hypertensive crisis) may cause: Headache. Fast or uneven heartbeats (palpitations). Shortness of breath. Nosebleed. Vomiting or feeling like you may vomit (nauseous). Changes in how you see. Very bad chest pain. Feeling dizzy. Seizures. How is this treated? This condition is treated by making healthy lifestyle changes, such as: Eating healthy foods. Exercising more. Drinking less alcohol. Your doctor may prescribe medicine if lifestyle changes do not help enough and if: Your top number is above 130. Your bottom number is above 80. Your personal target blood pressure may vary. Follow these instructions at home: Eating and drinking  If told, follow the DASH eating plan. To follow this plan: Fill one half of your plate at each meal with fruits and vegetables. Fill one fourth of your plate  at each meal with whole grains. Whole grains include whole-wheat pasta, brown rice, and whole-grain bread. Eat or drink low-fat dairy products, such as skim milk or low-fat yogurt. Fill one fourth of your plate at each meal with low-fat (lean) proteins. Low-fat proteins include fish, chicken without skin, eggs, beans, and tofu. Avoid fatty meat, cured and processed meat, or chicken with skin. Avoid pre-made or processed food. Limit the amount of salt in your diet to less than 1,500 mg each day. Do not drink alcohol if: Your doctor tells you not to drink. You are pregnant, may be pregnant, or are planning to become pregnant. If you drink alcohol: Limit how much you have to: 0-1 drink a day for women. 0-2 drinks a day for men. Know how much alcohol is in your drink. In the [place] one drink equals one 12 oz bottle of beer (355 mL), one 5 oz glass of wine (148 mL), or one 1 oz glass of hard liquor (44 mL). Lifestyle  Work with your doctor to stay at a healthy weight or to lose weight. Ask your doctor what the best weight is for you. Get at least 30 minutes of exercise that causes your heart to beat faster (aerobic exercise) most days of the week. This may include walking, swimming, or biking. Get at least 30 minutes of exercise that strengthens your muscles (resistance exercise) at least 3 days a week. This may include lifting weights or doing Pilates. Do not smoke or use any products that contain nicotine or tobacco. If you need help quitting, ask your doctor. Check your blood pressure at home as told by your doctor. Keep all follow-up visits. Medicines Take over-the-counter and prescription medicines  only as told by your doctor. Follow directions carefully. Do not skip doses of blood pressure medicine. The medicine does not work as well if you skip doses. Skipping doses also puts you at risk for problems. Ask your doctor about side effects or reactions to medicines that you should watch  for. Contact a doctor if: You think you are having a reaction to the medicine you are taking. You have headaches that keep coming back. You feel dizzy. You have swelling in your ankles. You have trouble with your vision. Get help right away if: You get a very bad headache. You start to feel mixed up (confused). You feel weak or numb. You feel faint. You have very bad pain in your: Chest. Belly (abdomen). You vomit more than once. You have trouble breathing. These symptoms may be an emergency. Get help right away. Call 911. Do not wait to see if the symptoms will go away. Do not drive yourself to the hospital. Summary Hypertension is another name for high blood pressure. High blood pressure forces your heart to work harder to pump blood. For most people, a normal blood pressure is less than 120/80. Making healthy choices can help lower blood pressure. If your blood pressure does not get lower with healthy choices, you may need to take medicine. This information is not intended to replace advice given to you by your health care provider. Make sure you discuss any questions you have with your health care provider. Document Revised: 04/19/2021 Document Reviewed: 04/19/2021 Elsevier Patient Education  2024 [employer].

## 2023-10-21 NOTE — Progress Notes (Signed)
 Del Favia, CMA,acting as a Neurosurgeon for Frances Epley, FNP.,have documented all relevant documentation on the behalf of Frances Epley, FNP,as directed by  Frances Epley, FNP while in the presence of Frances Epley, FNP.  Subjective:  Patient ID: Frances Estrada , female    DOB: [birth year] , 53 y.o.   MRN: [id number]  Chief Complaint  Patient presents with   Hypertension    HPI  Patient presents today for a bp follow up, Patient reports compliance with medication. Patient denies any chest pain, SOB, or headaches. Patient has no concerns today. She has been under increased stress due to caring for her father and considering changing positions at work.      Past Medical History:  Diagnosis Date   Acne 08/19/2011   Hypertension      History reviewed. No pertinent family history.   Current Outpatient Medications:    ALPRAZolam (XANAX) 0.25 MG tablet, Take 1 tablet (0.25 mg total) by mouth 2 (two) times daily as needed., Disp: 30 tablet, Rfl: 0   Cholecalciferol (VITAMIN D3 ULTRA STRENGTH) 125 MCG (5000 UT) capsule, Take 1 capsule (5,000 Units total) by mouth daily., Disp: 90 capsule, Rfl: 1   cyclobenzaprine (FLEXERIL) 10 MG tablet, Take 1 tablet (10 mg total) by mouth 3 (three) times daily as needed for muscle spasms., Disp: 30 tablet, Rfl: 0   hydrOXYzine (ATARAX) 25 MG tablet, Take 1 tablet (25 mg total) by mouth 3 (three) times daily as needed., Disp: 30 tablet, Rfl: 0   Magnesium 250 MG TABS, Take 1 tablet (250 mg total) by mouth daily., Disp: 90 tablet, Rfl: 1   triamcinolone ointment (KENALOG) 0.5 %, Apply 1 Application topically 2 (two) times daily., Disp: 60 g, Rfl: 3   valsartan-hydrochlorothiazide (DIOVAN-HCT) 160-12.5 MG tablet, Take 1 tablet by mouth daily., Disp: 90 tablet, Rfl: 1   buPROPion (WELLBUTRIN XL) 150 MG 24 hr tablet, Take 1 tablet (150 mg total) by mouth every morning., Disp: 90 tablet, Rfl: 1   diltiazem (CARDIZEM) 120 MG tablet, Take 1 tablet (120 mg total) by  mouth daily., Disp: 90 tablet, Rfl: 0   Semaglutide (RYBELSUS) 7 MG TABS, Take 1 tablet (7 mg total) by mouth every morning. 30 minutes before breakfast, Disp: 90 tablet, Rfl: 1   No Known Allergies   Review of Systems  Constitutional: Negative.  Negative for fatigue.  HENT: Negative.    Eyes: Negative.   Respiratory: Negative.    Cardiovascular: Negative.   Gastrointestinal: Negative.   Endocrine: Negative for polydipsia, polyphagia and polyuria.  Neurological: Negative.   Psychiatric/Behavioral: Negative.       Today's Vitals   10/21/23 1555  BP: 130/84  Pulse: 91  Temp: 98.6 F (37 C)  TempSrc: Oral  Weight: 227 lb (103 kg)  Height: 5\' 7"  (1.702 m)  PainSc: 0-No pain   Body mass index is 35.55 kg/m.  Wt Readings from Last 3 Encounters:  10/21/23 227 lb (103 kg)  03/27/23 216 lb 3.2 oz (98.1 kg)  10/15/22 212 lb 12.8 oz (96.5 kg)    Objective:  Physical Exam Vitals reviewed.  Constitutional:      General: She is not in acute distress.    Appearance: Normal appearance. She is obese.  Cardiovascular:     Rate and Rhythm: Normal rate and regular rhythm.     Pulses: Normal pulses.     Heart sounds: Normal heart sounds. No murmur heard. Pulmonary:     Effort: Pulmonary effort is normal. No  respiratory distress.     Breath sounds: Normal breath sounds. No wheezing.  Skin:    General: Skin is warm and dry.     Capillary Refill: Capillary refill takes less than 2 seconds.  Neurological:     General: No focal deficit present.     Mental Status: She is alert and oriented to person, place, and time.     Cranial Nerves: No cranial nerve deficit.     Motor: No weakness.  Psychiatric:        Attention and Perception: Attention and perception normal.        Mood and Affect: Mood and affect normal. Mood is not anxious.        Behavior: Behavior normal.        Thought Content: Thought content normal.        Cognition and Memory: Cognition normal.        Judgment:  Judgment normal.      Assessment And Plan:  Essential hypertension Assessment & Plan: Blood pressure is controlled, continue current medications  Orders: -     BMP8+eGFR -     dilTIAZem HCl; Take 1 tablet (120 mg total) by mouth daily.  Dispense: 90 tablet; Refill: 0  Prediabetes Assessment & Plan: HgbA1c had increased at last visit, will recheck levels today. Continue with healthy diet and regular exercise  Orders: -     Hemoglobin A1c  Anxiety Assessment & Plan: Continue current medications.   Orders: -     buPROPion HCl ER (XL); Take 1 tablet (150 mg total) by mouth every morning.  Dispense: 90 tablet; Refill: 1  Situational depression Assessment & Plan: She has not been taking the wellbutrin regular, encouraged to do so as this may help with her mood.   Orders: -     buPROPion HCl ER (XL); Take 1 tablet (150 mg total) by mouth every morning.  Dispense: 90 tablet; Refill: 1  Class 2 obesity due to excess calories with body mass index (BMI) of 35.0 to 35.9 in adult, unspecified whether serious comorbidity present Assessment & Plan: She is encouraged to strive for BMI less than 30 to decrease cardiac risk. Advised to aim for at least 150 minutes of exercise per week.     Return in about 4 months (around 02/20/2024) for bpc.   Patient was given opportunity to ask questions. Patient verbalized understanding of the plan and was able to repeat key elements of the plan. All questions were answered to their satisfaction.    Inge Mangle, FNP, have reviewed all documentation for this visit. The documentation on 10/21/23 for the exam, diagnosis, procedures, and orders are all accurate and complete.   IF YOU HAVE BEEN REFERRED TO A SPECIALIST, IT MAY TAKE 1-2 WEEKS TO SCHEDULE/PROCESS THE REFERRAL. IF YOU HAVE NOT HEARD FROM US /SPECIALIST IN TWO WEEKS, PLEASE GIVE US  A CALL AT [phone].

## 2023-10-22 ENCOUNTER — Encounter: Payer: Self-pay | Admitting: Nurse Practitioner

## 2023-10-22 ENCOUNTER — Other Ambulatory Visit: Payer: Self-pay | Admitting: Nurse Practitioner

## 2023-10-22 DIAGNOSIS — E1169 Type 2 diabetes mellitus with other specified complication: Secondary | ICD-10-CM

## 2023-10-22 LAB — HEMOGLOBIN A1C
Est. average glucose Bld gHb Est-mCnc: 157 mg/dL
Hgb A1c MFr Bld: 7.1 % — ABNORMAL HIGH (ref 4.8–5.6)

## 2023-10-22 LAB — BMP8+EGFR
BUN/Creatinine Ratio: 14 (ref 9–23)
BUN: 10 mg/dL (ref 6–24)
CO2: 23 mmol/L (ref 20–29)
Calcium: 9.7 mg/dL (ref 8.7–10.2)
Chloride: 100 mmol/L (ref 96–106)
Creatinine, Ser: 0.7 mg/dL (ref 0.57–1.00)
Glucose: 178 mg/dL — ABNORMAL HIGH (ref 70–99)
Potassium: 3.5 mmol/L (ref 3.5–5.2)
Sodium: 140 mmol/L (ref 134–144)
eGFR: 104 mL/min/{1.73_m2} (ref >59)

## 2023-10-22 MED ORDER — SEMAGLUTIDE(0.25 OR 0.5MG/DOS) 2 MG/3ML ~~LOC~~ SOPN: 0.5000 mg | PEN_INJECTOR | SUBCUTANEOUS | 1 refills | Status: DC

## 2023-10-22 MED ORDER — RYBELSUS 7 MG PO TABS: 1.0000 | ORAL_TABLET | Freq: Every morning | ORAL | 1 refills | Status: AC

## 2023-10-28 ENCOUNTER — Encounter: Payer: Self-pay | Admitting: Nurse Practitioner

## 2023-10-28 NOTE — Assessment & Plan Note (Signed)
 Blood pressure is controlled, continue current medications

## 2023-10-28 NOTE — Assessment & Plan Note (Signed)
 Continue current medications.

## 2023-10-28 NOTE — Assessment & Plan Note (Signed)
HgbA1c had increased at last visit, will recheck levels today. Continue with healthy diet and regular exercise

## 2023-10-28 NOTE — Assessment & Plan Note (Signed)
 She is encouraged to strive for BMI less than 30 to decrease cardiac risk. Advised to aim for at least 150 minutes of exercise per week.

## 2023-10-28 NOTE — Assessment & Plan Note (Signed)
 She has not been taking the wellbutrin regular, encouraged to do so as this may help with her mood.

## 2023-11-07 ENCOUNTER — Other Ambulatory Visit: Payer: Self-pay | Admitting: Nurse Practitioner

## 2023-11-07 DIAGNOSIS — M791 Myalgia, unspecified site: Secondary | ICD-10-CM

## 2023-11-07 MED ORDER — CYCLOBENZAPRINE HCL 10 MG PO TABS: 10.0000 mg | ORAL_TABLET | Freq: Three times a day (TID) | ORAL | 0 refills | Status: AC | PRN

## 2023-11-10 ENCOUNTER — Encounter: Payer: Self-pay | Admitting: Nurse Practitioner

## 2024-02-23 ENCOUNTER — Ambulatory Visit: Admitting: Nurse Practitioner

## 2024-03-23 ENCOUNTER — Other Ambulatory Visit: Payer: Self-pay | Admitting: Nurse Practitioner

## 2024-03-23 DIAGNOSIS — I10 Essential (primary) hypertension: Secondary | ICD-10-CM

## 2024-03-30 ENCOUNTER — Ambulatory Visit: Admitting: Nurse Practitioner

## 2024-04-05 ENCOUNTER — Other Ambulatory Visit: Payer: Self-pay | Admitting: Nurse Practitioner

## 2024-04-05 DIAGNOSIS — I10 Essential (primary) hypertension: Secondary | ICD-10-CM

## 2024-04-21 ENCOUNTER — Ambulatory Visit: Admitting: Nurse Practitioner

## 2024-05-12 ENCOUNTER — Ambulatory Visit: Admitting: Nurse Practitioner

## 2024-05-25 ENCOUNTER — Ambulatory Visit: Payer: Self-pay | Admitting: Nurse Practitioner

## 2024-06-18 ENCOUNTER — Encounter (HOSPITAL_COMMUNITY): Payer: Self-pay

## 2024-06-18 ENCOUNTER — Ambulatory Visit (HOSPITAL_COMMUNITY)
Admission: EM | Admit: 2024-06-18 | Discharge: 2024-06-18 | Disposition: A | Discharge status: Home / Self Care | Source: Ambulatory Visit

## 2024-06-18 ENCOUNTER — Encounter: Payer: Self-pay | Admitting: Nurse Practitioner

## 2024-06-18 DIAGNOSIS — H1032 Unspecified acute conjunctivitis, left eye: Secondary | ICD-10-CM

## 2024-06-18 DIAGNOSIS — J069 Acute upper respiratory infection, unspecified: Secondary | ICD-10-CM

## 2024-06-18 LAB — POC COVID19/FLU A&B COMBO
Covid Antigen, POC: NEGATIVE
Influenza A Antigen, POC: NEGATIVE
Influenza B Antigen, POC: NEGATIVE

## 2024-06-18 MED ORDER — CIPROFLOXACIN HCL 0.3 % OP SOLN: 1.0000 [drp] | OPHTHALMIC | 0 refills | Status: AC

## 2024-06-18 NOTE — ED Provider Notes (Signed)
 [facility]    CSN: [id number] Arrival date & time: 06/18/24  1524      History   Chief Complaint Chief Complaint  Patient presents with   Cough    HPI Frances Estrada is a 53 y.o. female.   Pt presents today due to subjective fever, nasal congestion, cough productive of purulent sputum, fatigue, and reduced appetite for 5 days. Pt states that she has been using ibuprofen for symptoms once and has been using dayquil and nyquil occasionally. Pt states that she is also experiencing erythema and pain in left with purulent drainage.   The history is provided by the patient.  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 Authorizing Provider  ALPRAZolam  (XANAX ) 0.25 MG tablet Take 1 tablet (0.25 mg total) by mouth 2 (two) times daily as needed. 10/12/20  Yes Georgina Speaks, FNP  buPROPion  (WELLBUTRIN  XL) 150 MG 24 hr tablet Take 1 tablet (150 mg total) by mouth every morning. 10/21/23 10/20/24 Yes Georgina Speaks, FNP  Cholecalciferol (VITAMIN D3 ULTRA STRENGTH) 125 MCG (5000 UT) capsule Take 1 capsule (5,000 Units total) by mouth  daily. 05/14/22  Yes Georgina Speaks, FNP  ciprofloxacin  (CILOXAN ) 0.3 % ophthalmic solution Place 1 drop into both eyes every 2 (two) hours. Administer 1 drop, every 2 hours, while awake, for 2 days. Then 1 drop, every 4 hours, while awake, for the next 5 days. 06/18/24  Yes Andra Corean BROCKS, PA-C  cyclobenzaprine  (FLEXERIL ) 10 MG tablet Take 1 tablet (10 mg total) by mouth 3 (three) times daily as needed for muscle spasms. 11/07/23  Yes Georgina Speaks, FNP  diltiazem  (CARDIZEM ) 120 MG tablet TAKE ONE TABLET BY MOUTH ONCE A DAY 04/05/24  Yes Moore, Janece, FNP  hydrOXYzine  (ATARAX ) 25 MG tablet Take 1 tablet (25 mg total) by mouth 3 (three) times daily as needed. 10/15/22  Yes Georgina Speaks, FNP  Semaglutide  (RYBELSUS ) 7 MG TABS Take 1 tablet (7 mg total) by mouth every morning. 30 minutes before breakfast 10/22/23  Yes Georgina Speaks, FNP  valsartan -hydrochlorothiazide  (DIOVAN -HCT) 160-12.5 MG tablet TAKE ONE TABLET BY MOUTH ONCE A DAY 04/05/24  Yes Georgina Speaks, FNP  Magnesium  250 MG TABS Take 1 tablet (250 mg total) by mouth daily. 10/12/20   Georgina Speaks, FNP  triamcinolone   ointment (KENALOG ) 0.5 % Apply 1 Application topically 2 (two) times daily. 03/27/23   Georgina Speaks, FNP    Family History History reviewed. No pertinent family history.  Social History Social History   Tobacco Use   Smoking status: Never   Smokeless tobacco: Never  Vaping Use   Vaping status: Never Used  Substance Use Topics   Alcohol use: Yes    Comment: social   Drug use: Never     Allergies   Patient has no known allergies.   Review of Systems Review of Systems  Respiratory:  Positive for cough.      Physical Exam Triage Vital Signs ED Triage Vitals  Encounter Vitals Group     BP 06/18/24 1544 (!) 146/87     Girls Systolic BP Percentile --      Girls Diastolic BP Percentile --      Boys Systolic BP Percentile --      Boys Diastolic BP Percentile --      Pulse Rate 06/18/24 1544 89     Resp  06/18/24 1544 18     Temp 06/18/24 1544 98.4 F (36.9 C)     Temp Source 06/18/24 1544 Oral     SpO2 06/18/24 1544 96 %     Weight 06/18/24 1543 227 lb (103 kg)     Height 06/18/24 1543 5' 7 (1.702 m)     Head Circumference --      Peak Flow --      Pain Score 06/18/24 1541 4     Pain Loc --      Pain Education --      Exclude from Growth Chart --    No data found.  Updated Vital Signs BP (!) 146/87 (BP Location: Left Arm)   Pulse 89   Temp 98.4 F (36.9 C) (Oral)   Resp 18   Ht 5' 7 (1.702 m)   Wt 227 lb (103 kg)   SpO2 96%   BMI 35.55 kg/m   Visual Acuity Right Eye Distance:   Left Eye Distance:   Bilateral Distance:    Right Eye Near:   Left Eye Near:    Bilateral Near:     Physical Exam Vitals and nursing note reviewed.  Constitutional:      General: She is not in acute distress.    Appearance: Normal appearance. She is not ill-appearing, toxic-appearing or diaphoretic.  HENT:     Right Ear: Tympanic membrane, ear canal and external ear normal.     Left Ear: Tympanic membrane, ear canal and external ear normal.     Nose: Congestion (markedly enlarged turbinates) present. No rhinorrhea.     Right Sinus: Maxillary sinus tenderness present. No frontal sinus tenderness.     Left Sinus: Maxillary sinus tenderness present. No frontal sinus tenderness.     Comments: No tenderness to palpation of upper lip    Mouth/Throat:     Mouth: Mucous membranes are moist.     Pharynx: Oropharynx is clear. No oropharyngeal exudate or posterior oropharyngeal erythema.  Eyes:     General: No scleral icterus. Cardiovascular:     Rate and Rhythm: Normal rate and regular rhythm.     Heart sounds: Normal heart sounds.  Pulmonary:     Effort: Pulmonary effort is normal. No respiratory distress.     Breath sounds: Normal breath sounds. No wheezing or rhonchi.  Skin:    General: Skin is warm.  Neurological:     Mental Status: She  is alert and oriented to person, place, and  time.  Psychiatric:        Mood and Affect: Mood normal.        Behavior: Behavior normal.      UC Treatments / Results  Labs (all labs ordered are listed, but only abnormal results are displayed) Labs Reviewed  POC COVID19/FLU A&B COMBO    EKG   Radiology No results found.  Procedures Procedures (including critical care time)  Medications Ordered in UC Medications - No data to display  Initial Impression / Assessment and Plan / UC Course  I have reviewed the triage vital signs and the nursing notes.  Pertinent labs & imaging results that were available during my care of the patient were reviewed by me and considered in my medical decision making (see chart for details).    Final Clinical Impressions(s) / UC Diagnoses   Final diagnoses:  Viral URI  Acute bacterial conjunctivitis of left eye     Discharge Instructions      You have been diagnosed with bacterial conjunctivitis or pinkeye today.  You are contagious until you take eyedrops for 24 hours.  Practice good hand hygiene, use soap and water.  Be sure not to share towels and washcloths, change them daily.  Symptoms should improve in 3 days, but use eyedrops for 7.  If you experience changes in vision you need to follow-up with ophthalmologist as soon as possible.   You been diagnosed with a viral illness today. -Viruses have to run their course and medicines that are prescribed are meant to help with symptoms. - With viruses usually feel poorly from 3 to 7 days with cough being the last symptoms to resolve.  -Cough can linger from days to weeks.  Antibiotics are not effective for viruses. -If your cough lasts more than 2 weeks and you are coughing so hard that you are vomiting or feel like you could pass out we need to follow-up with PCP for further testing and evaluation. -Rest, increase water intake, may use pseudoephedrine for nasal congestion, Delsym (dextromethorphan) or honey as needed for cough, and  ibuprofen and/or Tylenol  as directed on packaging for pain and fever. -If you have hypertension you should take Coricidin or other OTC meds approved for people with high blood pressure. -You may use a spoonful of honey every 4-6 hours as needed for throat pain and cough. -Warm tea with honey and lemon are helpful for soothe throat as well.  Chloraseptic and Cepacol make a throat lozenge with numbing medication, can be purchased over-the-counter. -May also use Flonase or sinus rinse for sinus pressure or nasal congestion.  Be sure to use distilled bottled water for sinus rinses. -May use coolmist humidifier to open up nasal passages -May elevate head to assist with postnasal drainage. -If you feel poorly (fever, fatigue, shortness of breath, nausea, etc.) for more than 10 days to be sure to follow-up with PCP or in clinic for further evaluation and additional treatments. If you experience chest pain with shortness of breath or pulse oxygen less than 95% you should report to the ER.      ED Prescriptions     Medication Sig Dispense Auth. Provider   ciprofloxacin  (CILOXAN ) 0.3 % ophthalmic solution Place 1 drop into both eyes every 2 (two) hours. Administer 1 drop, every 2 hours, while awake, for 2 days. Then 1 drop, every 4 hours, while awake, for the next 5 days. 5 mL Andra Corean BROCKS, PA-C  PDMP not reviewed this encounter.   Andra Corean BROCKS, PA-C 06/18/24 [id number]

## 2024-06-18 NOTE — Discharge Instructions (Addendum)
 You have been diagnosed with bacterial conjunctivitis or pinkeye today.  You are contagious until you take eyedrops for 24 hours.  Practice good hand hygiene, use soap and water.  Be sure not to share towels and washcloths, change them daily.  Symptoms should improve in 3 days, but use eyedrops for 7.  If you experience changes in vision you need to follow-up with ophthalmologist as soon as possible.  You been diagnosed with a viral illness today. -Viruses have to run their course and medicines that are prescribed are meant to help with symptoms. - With viruses usually feel poorly from 3 to 7 days with cough being the last symptoms to resolve.  -Cough can linger from days to weeks.  Antibiotics are not effective for viruses. -If your cough lasts more than 2 weeks and you are coughing so hard that you are vomiting or feel like you could pass out we need to follow-up with PCP for further testing and evaluation. -Rest, increase water intake, may use pseudoephedrine for nasal congestion, Delsym (dextromethorphan) or honey as needed for cough, and ibuprofen  and/or Tylenol as directed on packaging for pain and fever. -If you have hypertension you should take Coricidin or other OTC meds approved for people with high blood pressure. -You may use a spoonful of honey every 4-6 hours as needed for throat pain and cough. -Warm tea with honey and lemon are helpful for soothe throat as well.  Chloraseptic and Cepacol make a throat lozenge with numbing medication, can be purchased over-the-counter. -May also use Flonase or sinus rinse for sinus pressure or nasal congestion.  Be sure to use distilled bottled water for sinus rinses. -May use coolmist humidifier to open up nasal passages -May elevate head to assist with postnasal drainage. -If you feel poorly (fever, fatigue, shortness of breath, nausea, etc.) for more than 10 days to be sure to follow-up with PCP or in clinic for further evaluation and additional  treatments. If you experience chest pain with shortness of breath or pulse oxygen less than 95% you should report to the ER.

## 2024-06-18 NOTE — ED Triage Notes (Signed)
 Dry cough, headache with nausea, sinus pressure, sore throat, hoarse, and ear pain onset Monday night. States the symptoms were worse on Wednesday. No known sick exposure.   Patient tried otc ibuprofen, Nyquil with slight relief.

## 2024-10-13 ENCOUNTER — Encounter: Admitting: Nurse Practitioner
